# Patient Record
Sex: Female | Born: 1980 | Race: White | Hispanic: No | Smoking: Former smoker
Health system: Southern US, Community
[De-identification: ages and names within clinical notes are randomized; demographics above are authoritative.]

## PROBLEM LIST (undated history)

## (undated) ENCOUNTER — Inpatient Hospital Stay (HOSPITAL_COMMUNITY): Payer: Self-pay

## (undated) DIAGNOSIS — F419 Anxiety disorder, unspecified: Secondary | ICD-10-CM

## (undated) DIAGNOSIS — S3992XA Unspecified injury of lower back, initial encounter: Secondary | ICD-10-CM

## (undated) DIAGNOSIS — Z8742 Personal history of other diseases of the female genital tract: Secondary | ICD-10-CM

## (undated) DIAGNOSIS — R87629 Unspecified abnormal cytological findings in specimens from vagina: Secondary | ICD-10-CM

## (undated) DIAGNOSIS — M722 Plantar fascial fibromatosis: Secondary | ICD-10-CM

## (undated) DIAGNOSIS — R51 Headache: Secondary | ICD-10-CM

## (undated) DIAGNOSIS — O09519 Supervision of elderly primigravida, unspecified trimester: Secondary | ICD-10-CM

## (undated) DIAGNOSIS — F32A Depression, unspecified: Secondary | ICD-10-CM

## (undated) DIAGNOSIS — F329 Major depressive disorder, single episode, unspecified: Secondary | ICD-10-CM

## (undated) DIAGNOSIS — M549 Dorsalgia, unspecified: Secondary | ICD-10-CM

## (undated) DIAGNOSIS — M5126 Other intervertebral disc displacement, lumbar region: Secondary | ICD-10-CM

## (undated) DIAGNOSIS — G8929 Other chronic pain: Secondary | ICD-10-CM

## (undated) HISTORY — PX: DILATION AND CURETTAGE OF UTERUS: SHX78

## (undated) HISTORY — DX: Supervision of elderly primigravida, unspecified trimester: O09.519

## (undated) HISTORY — PX: CHOLECYSTECTOMY: SHX55

## (undated) HISTORY — DX: Anxiety disorder, unspecified: F41.9

## (undated) HISTORY — DX: Major depressive disorder, single episode, unspecified: F32.9

## (undated) HISTORY — PX: OTHER SURGICAL HISTORY: SHX169

## (undated) HISTORY — DX: Headache: R51

## (undated) HISTORY — DX: Depression, unspecified: F32.A

## (undated) HISTORY — PX: DILATION AND EVACUATION: SHX1459

## (undated) HISTORY — DX: Personal history of other diseases of the female genital tract: Z87.42

## (undated) HISTORY — PX: LAPAROSCOPY: SHX197

## (undated) HISTORY — DX: Unspecified abnormal cytological findings in specimens from vagina: R87.629

## (undated) HISTORY — PX: TUBAL LIGATION: SHX77

---

## 2006-11-16 ENCOUNTER — Inpatient Hospital Stay (HOSPITAL_COMMUNITY): Admission: AD | Admit: 2006-11-16 | Discharge: 2006-11-16 | Payer: Self-pay | Admitting: Obstetrics and Gynecology

## 2007-01-02 ENCOUNTER — Ambulatory Visit (HOSPITAL_COMMUNITY): Admission: RE | Admit: 2007-01-02 | Discharge: 2007-01-02 | Payer: Self-pay | Admitting: Surgery

## 2007-01-02 ENCOUNTER — Encounter (INDEPENDENT_AMBULATORY_CARE_PROVIDER_SITE_OTHER): Payer: Self-pay | Admitting: Specialist

## 2008-04-22 ENCOUNTER — Emergency Department (HOSPITAL_COMMUNITY): Admission: EM | Admit: 2008-04-22 | Discharge: 2008-04-22 | Payer: Self-pay | Admitting: Emergency Medicine

## 2009-12-14 ENCOUNTER — Emergency Department: Payer: Self-pay | Admitting: Emergency Medicine

## 2009-12-20 ENCOUNTER — Encounter: Admission: RE | Admit: 2009-12-20 | Discharge: 2009-12-20 | Payer: Self-pay | Admitting: Family Medicine

## 2010-01-13 ENCOUNTER — Emergency Department: Payer: Self-pay | Admitting: Emergency Medicine

## 2010-07-20 ENCOUNTER — Emergency Department: Payer: Self-pay | Admitting: Unknown Physician Specialty

## 2010-08-09 ENCOUNTER — Ambulatory Visit (HOSPITAL_COMMUNITY): Admission: RE | Admit: 2010-08-09 | Discharge: 2010-08-09 | Payer: Self-pay | Admitting: Obstetrics and Gynecology

## 2010-09-01 DEATH — deceased

## 2011-02-14 LAB — CBC
Platelets: 199 10*3/uL (ref 150–400)
RBC: 4.25 MIL/uL (ref 3.87–5.11)

## 2011-02-14 LAB — ABO/RH: ABO/RH(D): O POS

## 2011-04-19 NOTE — Op Note (Signed)
NAMESHARLEEN, [name]             ACCOUNT NO.:  [id number]   MEDICAL RECORD NO.:  [id number]          PATIENT TYPE:  AMB   LOCATION:  DAY                          FACILITY:  [facility]   PHYSICIAN:  Thomas A. Cornett, M.D.DATE OF BIRTH:  July 24, 1981   DATE OF PROCEDURE:  01/02/2007  DATE OF DISCHARGE:                               OPERATIVE REPORT   PREOPERATIVE DIAGNOSIS:  Symptomatic cholelithiasis.   POSTOPERATIVE DIAGNOSIS:  Symptomatic cholelithiasis.   PROCEDURE:  Laparoscopic cholecystectomy with intraoperative  cholangiogram.   SURGEON:  Dr. Harriette Bouillon.   ASSISTANT:  Dr. Cyndia Bent.   ANESTHESIA:  General endotracheal anesthesia with 20 mL of 0.25%  Sensorcaine without epinephrine.   ESTIMATED BLOOD LOSS:  30 mL.   DRAINS:  None.   SPECIMEN:  Gallbladder to pathology for evaluation.   INDICATIONS FOR PROCEDURE:  The patient is 30 year old female whose had  right upper quadrant pain and gallstones which have been symptomatic.  She has a diagnosis of symptomatic cholelithiasis and I recommended a  laparoscopic cholecystectomy for this.  She understood the procedure,  the risks and complications of the procedure which were outlined in the  history of physical and agreed to proceed.   DESCRIPTION OF PROCEDURE:  The patient was brought to the operating room  and placed supine.  After induction of general endotracheal anesthesia,  the abdomen prepped and draped in a sterile fashion.  She received  preoperative antibiotics 30 minutes before operative incision time. The  oral gastric  tube was in place.  A 1 cm supraumbilical incision was  made.  The fascia was identified and a small incision was made in the  fascia and  the fascia was grasped with Kochers.  I used my finger to  push through the peritoneal lining into the abdominal cavity without  difficulty.  A pursestring suture was then placed.  Hassan cannula was  placed under direct vision into the abdominal  cavity.  Pneumoperitoneum  was created to 15 mmHg with CO2 and a laparoscope was placed.  Laparoscopy was performed.  I saw no injury to solid or hollow organ  injury with insertion of the [facility].  Next the gallbladder and liver  were identified.  The patient was placed in reverse Trendelenburg and  rolled to her left.  A small 1 cm subxiphoid incision was placed and the  11-mm port was placed through that into the epigastrium.  Two 5 mm ports  were placed without difficulty in the right mid abdomen.  The  gallbladder was grabbed by its dome and retracted toward the patient's  right shoulder.  A second grasper was used to grab the infundibulum.  I  was able to pull some filmy adhesions away from the infundibulum of the  gallbladder to expose the cystic duct.  I was then able to dissect  circumferentially around the cystic duct.  This was the only tubular  structure entering the gallbladder.  A clip was placed in the  gallbladder side of this and a small incision was made in the cystic  duct until I got bile back. Through a separate stab  incision, a Cook  cholangiogram catheter was introduced, placed in the cystic duct and  held in place by a clip.  Intraoperative cholangiogram was performed.  The cystic duct was quite small.  There was flow into a nondilated  common bile duct into the duodenum.  There was then contrast that  refluxed up the common hepatic duct into right and left hepatic ducts.  She had no signs of stone obstruction or stricture or dilatation.  The  catheter was removed, the cystic duct was triple clipped and divided.  The cystic artery was identified, it was double clipped and divided.  I  placed a clip on a posterior branch of the cystic artery and divided the  remainder of that with the cautery on the posterior part of the  gallbladder.  I then used cautery to dissect the gallbladder from the  gallbladder fossa.  A small hole was made in the gallbladder that  spilled  bile but not stones.  We removed the remainder of the  gallbladder without difficulty.  It was placed in an EndoCatch bag.  We  then inspected the gallbladder bed, found it to be hemostatic, irrigated  out all the old bile and blood.  The clips were on the cystic duct and  cystic artery respectively with no signs of bleeding or bile leakage.  After the irrigation was suctioned out completely, the gallbladder was  extracted through the umbilicus and passed off the field.  I then closed  the umbilical fascial incision with #0 Vicryl.  The remainder of the  excess irrigation was suctioned out.  I reinspected the abdominal cavity  and saw no signs of hollow or solid organ injury at this point in time.  The ports were removed with no signs of port site bleeding, the CO2 was  allowed to escape.  I then closed all skin incisions with 4-0 Monocryl.  Steri-Strips and dry dressings were applied.  All final counts of  sponge, needle and instruments were found to be correct at the end of  the case.  The patient was then awoke and taken to recovery in  satisfactory condition.      Thomas A. Cornett, M.D.  Electronically Signed     TAC/MEDQ  D:  01/02/2007  T:  01/02/2007  Job:  [id number]   cc:   Caryn Bee L. Little, M.D.  Fax: [phone]

## 2012-04-14 ENCOUNTER — Ambulatory Visit: Payer: Self-pay | Admitting: Family Medicine

## 2012-05-28 ENCOUNTER — Emergency Department: Payer: Self-pay | Admitting: Emergency Medicine

## 2012-06-18 ENCOUNTER — Emergency Department: Payer: Self-pay | Admitting: Emergency Medicine

## 2012-06-18 LAB — COMPREHENSIVE METABOLIC PANEL
Anion Gap: 8 (ref 7–16)
BUN: 14 mg/dL (ref 7–18)
Bilirubin,Total: 0.2 mg/dL (ref 0.2–1.0)
Calcium, Total: 8.9 mg/dL (ref 8.5–10.1)
Chloride: 107 mmol/L (ref 98–107)
EGFR (African American): >60
EGFR (Non-African Amer.): >60
Glucose: 92 mg/dL (ref 65–99)
Osmolality: 281 (ref 275–301)
Potassium: 3.8 mmol/L (ref 3.5–5.1)
Sodium: 141 mmol/L (ref 136–145)
Total Protein: 7.7 g/dL (ref 6.4–8.2)

## 2012-06-18 LAB — URINALYSIS, COMPLETE
Glucose,UR: NEGATIVE mg/dL (ref 0–75)
Nitrite: NEGATIVE
Protein: NEGATIVE

## 2012-06-18 LAB — CBC
HCT: 42.5 % (ref 35.0–47.0)
HGB: 14.3 g/dL (ref 12.0–16.0)
MCHC: 33.5 g/dL (ref 32.0–36.0)
MCV: 92 fL (ref 80–100)
Platelet: 226 10*3/uL (ref 150–440)
RBC: 4.6 10*6/uL (ref 3.80–5.20)
RDW: 12.8 % (ref 11.5–14.5)
WBC: 13.5 10*3/uL — ABNORMAL HIGH (ref 3.6–11.0)

## 2013-12-02 NOTE — L&D Delivery Note (Signed)
Delivery Note At 9:21 AM a viable female was delivered via Vaginal, Spontaneous Delivery OA Presentation Apgars 9 9 weight pending Placenta status spontaneously with 3 vessel cord: , .  Cord:  with the following complications:none .  Cord pH: not obtained  Anesthesia:  epidural Episiotomy: none Lacerations: 2nd Suture Repair: 3.0 chromic Est. Blood Loss (mL): 300  Mom to postpartum.  Baby to Couplet care / Skin to Skin.  Malissia Rabbani L 08/16/2014, 10:00 AM

## 2014-01-11 LAB — OB RESULTS CONSOLE RPR: RPR: NONREACTIVE

## 2014-01-11 LAB — OB RESULTS CONSOLE ABO/RH: RH TYPE: POSITIVE

## 2014-01-11 LAB — OB RESULTS CONSOLE GC/CHLAMYDIA
Chlamydia: NEGATIVE
GC PROBE AMP, GENITAL: NEGATIVE

## 2014-01-11 LAB — OB RESULTS CONSOLE ANTIBODY SCREEN: ANTIBODY SCREEN: NEGATIVE

## 2014-01-11 LAB — OB RESULTS CONSOLE HEPATITIS B SURFACE ANTIGEN: Hepatitis B Surface Ag: NEGATIVE

## 2014-01-11 LAB — OB RESULTS CONSOLE HIV ANTIBODY (ROUTINE TESTING): HIV: NONREACTIVE

## 2014-01-11 LAB — OB RESULTS CONSOLE RUBELLA ANTIBODY, IGM: Rubella: NON-IMMUNE/NOT IMMUNE

## 2014-03-03 ENCOUNTER — Encounter (HOSPITAL_COMMUNITY): Payer: Self-pay | Admitting: *Deleted

## 2014-03-03 ENCOUNTER — Inpatient Hospital Stay (HOSPITAL_COMMUNITY)
Admission: AD | Admit: 2014-03-03 | Discharge: 2014-03-03 | Disposition: A | Discharge status: Home / Self Care | Payer: BC Managed Care – PPO | Source: Ambulatory Visit | Attending: Obstetrics and Gynecology | Admitting: Obstetrics and Gynecology

## 2014-03-03 DIAGNOSIS — Z87891 Personal history of nicotine dependence: Secondary | ICD-10-CM | POA: Insufficient documentation

## 2014-03-03 DIAGNOSIS — R51 Headache: Secondary | ICD-10-CM | POA: Insufficient documentation

## 2014-03-03 DIAGNOSIS — O99891 Other specified diseases and conditions complicating pregnancy: Secondary | ICD-10-CM | POA: Insufficient documentation

## 2014-03-03 DIAGNOSIS — R197 Diarrhea, unspecified: Secondary | ICD-10-CM

## 2014-03-03 DIAGNOSIS — O9989 Other specified diseases and conditions complicating pregnancy, childbirth and the puerperium: Principal | ICD-10-CM

## 2014-03-03 NOTE — MAU Note (Signed)
Pt states she has been having bloody stool today. Pt states diarrhea or very soft stool x 10 days. Pt seen by Dr Rana SnareLowe but is now having bloody stool.

## 2014-03-03 NOTE — Discharge Instructions (Signed)
Diet for Diarrhea, Adult  Frequent, runny stools (diarrhea) may be caused or worsened by food or drink. Diarrhea may be relieved by changing your diet. Since diarrhea can last up to 7 days, it is easy for you to lose too much fluid from the body and become dehydrated. Fluids that are lost need to be replaced. Along with a modified diet, make sure you drink enough fluids to keep your urine clear or pale yellow.  DIET INSTRUCTIONS  · Ensure adequate fluid intake (hydration): have 1 cup (8 oz) of fluid for each diarrhea episode. Avoid fluids that contain simple sugars or sports drinks, fruit juices, whole milk products, and sodas. Your urine should be clear or pale yellow if you are drinking enough fluids. Hydrate with an oral rehydration solution that you can purchase at pharmacies, retail stores, and online. You can prepare an oral rehydration solution at home by mixing the following ingredients together:  ·   tsp table salt.  · ¾ tsp baking soda.  ·  tsp salt substitute containing potassium chloride.  · 1  tablespoons sugar.  · 1 L (34 oz) of water.  · Certain foods and beverages may increase the speed at which food moves through the gastrointestinal (GI) tract. These foods and beverages should be avoided and include:  · Caffeinated and alcoholic beverages.  · High-fiber foods, such as raw fruits and vegetables, nuts, seeds, and whole grain breads and cereals.  · Foods and beverages sweetened with sugar alcohols, such as xylitol, sorbitol, and mannitol.  · Some foods may be well tolerated and may help thicken stool including:  · Starchy foods, such as rice, toast, pasta, low-sugar cereal, oatmeal, grits, baked potatoes, crackers, and bagels.    · Bananas.    · Applesauce.  · Add probiotic-rich foods to help increase healthy bacteria in the GI tract, such as yogurt and fermented milk products.  RECOMMENDED FOODS AND BEVERAGES  Starches  Choose foods with less than 2 g of fiber per serving.  · Recommended:  White,  French, and pita breads, plain rolls, buns, bagels. Plain muffins, matzo. Soda, saltine, or graham crackers. Pretzels, melba toast, zwieback. Cooked cereals made with water: cornmeal, farina, cream cereals. Dry cereals: refined corn, wheat, rice. Potatoes prepared any way without skins, refined macaroni, spaghetti, noodles, refined rice.  · Avoid:  Bread, rolls, or crackers made with whole wheat, multi-grains, rye, bran seeds, nuts, or coconut. Corn tortillas or taco shells. Cereals containing whole grains, multi-grains, bran, coconut, nuts, raisins. Cooked or dry oatmeal. Coarse wheat cereals, granola. Cereals advertised as "high-fiber." Potato skins. Whole grain pasta, wild or brown rice. Popcorn. Sweet potatoes, yams. Sweet rolls, doughnuts, waffles, pancakes, sweet breads.  Vegetables  · Recommended: Strained tomato and vegetable juices. Most well-cooked and canned vegetables without seeds. Fresh: Tender lettuce, cucumber without the skin, cabbage, spinach, bean sprouts.  · Avoid: Fresh, cooked, or canned: Artichokes, baked beans, beet greens, broccoli, Brussels sprouts, corn, kale, legumes, peas, sweet potatoes. Cooked: Green or red cabbage, spinach. Avoid large servings of any vegetables because vegetables shrink when cooked, and they contain more fiber per serving than fresh vegetables.  Fruit  · Recommended: Cooked or canned: Apricots, applesauce, cantaloupe, cherries, fruit cocktail, grapefruit, grapes, kiwi, mandarin oranges, peaches, pears, plums, watermelon. Fresh: Apples without skin, ripe banana, grapes, cantaloupe, cherries, grapefruit, peaches, oranges, plums. Keep servings limited to ½ cup or 1 piece.  · Avoid: Fresh: Apples with skin, apricots, mangoes, pears, raspberries, strawberries. Prune juice, stewed or dried prunes. Dried   fruits, raisins, dates. Large servings of all fresh fruits.  Protein  · Recommended: Ground or well-cooked tender beef, ham, veal, lamb, pork, or poultry. Eggs. Fish,  oysters, shrimp, lobster, other seafoods. Liver, organ meats.  · Avoid: Tough, fibrous meats with gristle. Peanut butter, smooth or chunky. Cheese, nuts, seeds, legumes, dried peas, beans, lentils.  Dairy  · Recommended: Yogurt, lactose-free milk, kefir, drinkable yogurt, buttermilk, soy milk, or plain hard cheese.  · Avoid: Milk, chocolate milk, beverages made with milk, such as milkshakes.  Soups  · Recommended: Bouillon, broth, or soups made from allowed foods. Any strained soup.  · Avoid: Soups made from vegetables that are not allowed, cream or milk-based soups.  Desserts and Sweets  · Recommended: Sugar-free gelatin, sugar-free frozen ice pops made without sugar alcohol.  · Avoid: Plain cakes and cookies, pie made with fruit, pudding, custard, cream pie. Gelatin, fruit, ice, sherbet, frozen ice pops. Ice cream, ice milk without nuts. Plain hard candy, honey, jelly, molasses, syrup, sugar, chocolate syrup, gumdrops, marshmallows.  Fats and Oils  · Recommended: Limit fats to less than 8 tsp per day.  · Avoid: Seeds, nuts, olives, avocados. Margarine, butter, cream, mayonnaise, salad oils, plain salad dressings. Plain gravy, crisp bacon without rind.  Beverages  · Recommended: Water, decaffeinated teas, oral rehydration solutions, sugar-free beverages not sweetened with sugar alcohols.  · Avoid: Fruit juices, caffeinated beverages (coffee, tea, soda), alcohol, sports drinks, or lemon-lime soda.  Condiments  · Recommended: Ketchup, mustard, horseradish, vinegar, cocoa powder. Spices in moderation: allspice, basil, bay leaves, celery powder or leaves, cinnamon, cumin powder, curry powder, ginger, mace, marjoram, onion or garlic powder, oregano, paprika, parsley flakes, ground pepper, rosemary, sage, savory, tarragon, thyme, turmeric.  · Avoid: Coconut, honey.  Document Released: 02/08/2004 Document Revised: 08/12/2012 Document Reviewed: 04/03/2012  Exit[employer]® Patient Information ©2014 [employer].

## 2014-03-03 NOTE — MAU Provider Note (Signed)
  History     CSN: [id number]  Arrival date and time: 03/03/14 2047   First Provider Initiated Contact with Patient 03/03/14 2310      Chief Complaint  Patient presents with  . Diarrhea   HPI  [name] is a 33 y.o. G2P0010 at 1668w3d who presents today with diarrhea. She states that she has had diarrhea since 02/21/14. She saw Dr. Rana SnareLowe in the office on 02/23/14. She did not have diarrhea for two days after that, but then it returned. She states that she has a soft stool each day, and occasionally she has a liquid stool. She denies any fever. She denies any recent antibiotics recently. Her step-daughter has been at their house since Monday, and has had vomiting. No other close contacts who have been sick. 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 She denies any nausea or vomiting. She states that occasionally the diarrhea will have a bad odor.   Past Medical History  Diagnosis Date  . Medical history non-contributory     Past Surgical History  Procedure Laterality Date  . Cholecystectomy    . Leep    . Dilation and curettage of uterus    . Dilation and evacuation    . Dilation and curettage of uterus    . Laparoscopy      No family history on file.  History  Substance Use Topics  . Smoking status: Former Smoker    Quit date: 03/03/2014  . Smokeless tobacco: Not on file  . Alcohol Use: Not on file    Allergies:  Allergies  Allergen Reactions  . Celebrex [Celecoxib]     hvies  . Wellbutrin [Bupropion]     hives    Prescriptions prior to admission  Medication Sig Dispense Refill  . Multiple Vitamins-Minerals (MULTIVITAMIN WITH MINERALS) tablet Take 1 tablet by mouth daily.        ROS Physical Exam   Blood pressure 117/69, pulse 78, temperature 98.5 F (36.9 C), temperature source Oral, height 5\' 6"  (1.676 m), weight 104.951 kg (231 lb 6 oz).  Physical Exam  Nursing note and vitals reviewed. Constitutional: She is oriented to person, place, and time.  She appears well-developed and well-nourished. No distress.  Cardiovascular: Normal rate.   Respiratory: Effort normal.  GI: Soft. There is no tenderness.  Neurological: She is alert and oriented to person, place, and time.  Skin: Skin is warm and dry.  Psychiatric: She has a normal mood and affect.    MAU Course  Procedures  2328: D/W With Dr. Marcelle OverlieHolland, ok for DC home at this time. Do not need to collect sample for culture or ova and parasite at this time. Will consider GI consult if sx persist.  Assessment and Plan   1. Diarrhea    BRAT diet Imodium OTC PRN FU with the office  Return to MAU as needed   Tawnya CrookHogan, Taneil Lazarus Donovan 03/03/2014, 11:14 PM

## 2014-03-03 NOTE — MAU Note (Signed)
PT SAYS  SHE WENT TO B-ROOM AT 450PM-   HAD SOFT  BM-  BLOODY -  DENIES HEMORROIDS.     DENIES RECTAL PAIN.   .  WENT TO DR LOWE ON 3-25- FOR DIARRHEA.-   THEN 28-29-  ALL OK.    THEN  ON 3-30-  STARTED  HAVING  SOFT - LOOSE BM    AGAIN-  ONLY TODAY- SAW BLOOD.

## 2014-03-03 NOTE — Progress Notes (Signed)
Pt states she had a headache 2 days ago

## 2014-05-24 ENCOUNTER — Encounter (HOSPITAL_COMMUNITY): Payer: Self-pay

## 2014-07-14 LAB — OB RESULTS CONSOLE GBS: GBS: NEGATIVE

## 2014-08-15 ENCOUNTER — Encounter (HOSPITAL_COMMUNITY): Payer: Self-pay | Admitting: *Deleted

## 2014-08-15 ENCOUNTER — Telehealth (HOSPITAL_COMMUNITY): Payer: Self-pay | Admitting: *Deleted

## 2014-08-15 ENCOUNTER — Inpatient Hospital Stay (HOSPITAL_COMMUNITY)
Admission: RE | Admit: 2014-08-15 | Discharge: 2014-08-18 | DRG: 775 | Disposition: A | Discharge status: Home / Self Care | Payer: BC Managed Care – PPO | Source: Ambulatory Visit | Attending: Obstetrics and Gynecology | Admitting: Obstetrics and Gynecology

## 2014-08-15 DIAGNOSIS — F341 Dysthymic disorder: Secondary | ICD-10-CM | POA: Diagnosis present

## 2014-08-15 DIAGNOSIS — Z87891 Personal history of nicotine dependence: Secondary | ICD-10-CM

## 2014-08-15 DIAGNOSIS — D649 Anemia, unspecified: Secondary | ICD-10-CM | POA: Diagnosis present

## 2014-08-15 DIAGNOSIS — O9902 Anemia complicating childbirth: Secondary | ICD-10-CM | POA: Diagnosis present

## 2014-08-15 DIAGNOSIS — Z8249 Family history of ischemic heart disease and other diseases of the circulatory system: Secondary | ICD-10-CM

## 2014-08-15 DIAGNOSIS — Z3490 Encounter for supervision of normal pregnancy, unspecified, unspecified trimester: Secondary | ICD-10-CM

## 2014-08-15 DIAGNOSIS — O99344 Other mental disorders complicating childbirth: Secondary | ICD-10-CM | POA: Diagnosis present

## 2014-08-15 DIAGNOSIS — O4100X0 Oligohydramnios, unspecified trimester, not applicable or unspecified: Secondary | ICD-10-CM | POA: Diagnosis present

## 2014-08-15 DIAGNOSIS — Z833 Family history of diabetes mellitus: Secondary | ICD-10-CM

## 2014-08-15 DIAGNOSIS — O09529 Supervision of elderly multigravida, unspecified trimester: Secondary | ICD-10-CM | POA: Diagnosis present

## 2014-08-15 LAB — CBC
HEMATOCRIT: 33.7 % — AB (ref 36.0–46.0)
HEMOGLOBIN: 11.6 g/dL — AB (ref 12.0–15.0)
MCH: 31.6 pg (ref 26.0–34.0)
MCHC: 34.4 g/dL (ref 30.0–36.0)
MCV: 91.8 fL (ref 78.0–100.0)
Platelets: 236 10*3/uL (ref 150–400)
RBC: 3.67 MIL/uL — AB (ref 3.87–5.11)
RDW: 14 % (ref 11.5–15.5)
WBC: 16.4 10*3/uL — AB (ref 4.0–10.5)

## 2014-08-15 MED ORDER — OXYTOCIN BOLUS FROM INFUSION
500.0000 mL | INTRAVENOUS | Status: DC
  Administered 2014-08-16: 500 mL via INTRAVENOUS

## 2014-08-15 MED ORDER — MISOPROSTOL 25 MCG QUARTER TABLET: 25.0000 ug | ORAL_TABLET | ORAL | Status: DC | PRN

## 2014-08-15 MED ORDER — ZOLPIDEM TARTRATE 5 MG PO TABS: 5.0000 mg | ORAL_TABLET | Freq: Every evening | ORAL | Status: DC | PRN

## 2014-08-15 MED ORDER — TERBUTALINE SULFATE 1 MG/ML IJ SOLN: 0.2500 mg | Freq: Once | INTRAMUSCULAR | Status: AC | PRN

## 2014-08-15 MED ORDER — OXYCODONE-ACETAMINOPHEN 5-325 MG PO TABS: 2.0000 | ORAL_TABLET | ORAL | Status: DC | PRN

## 2014-08-15 MED ORDER — MISOPROSTOL 25 MCG QUARTER TABLET
25.0000 ug | ORAL_TABLET | ORAL | Status: DC | PRN
  Administered 2014-08-15: 25 ug via VAGINAL
  Filled 2014-08-15: qty 0.25
  Filled 2014-08-15: qty 1
  Filled 2014-08-15: qty 0.25

## 2014-08-15 MED ORDER — CITRIC ACID-SODIUM CITRATE 334-500 MG/5ML PO SOLN: 30.0000 mL | ORAL | Status: DC | PRN

## 2014-08-15 MED ORDER — LACTATED RINGERS IV SOLN
INTRAVENOUS | Status: DC
  Administered 2014-08-15 – 2014-08-16 (×2): via INTRAVENOUS

## 2014-08-15 MED ORDER — OXYCODONE-ACETAMINOPHEN 5-325 MG PO TABS: 1.0000 | ORAL_TABLET | ORAL | Status: DC | PRN

## 2014-08-15 MED ORDER — LACTATED RINGERS IV SOLN
500.0000 mL | INTRAVENOUS | Status: DC | PRN
  Administered 2014-08-15: 500 mL via INTRAVENOUS

## 2014-08-15 MED ORDER — ACETAMINOPHEN 325 MG PO TABS: 650.0000 mg | ORAL_TABLET | ORAL | Status: DC | PRN

## 2014-08-15 MED ORDER — FLEET ENEMA 7-19 GM/118ML RE ENEM: 1.0000 | ENEMA | RECTAL | Status: DC | PRN

## 2014-08-15 MED ORDER — OXYTOCIN 40 UNITS IN LACTATED RINGERS INFUSION - SIMPLE MED: 62.5000 mL/h | INTRAVENOUS | Status: DC

## 2014-08-15 MED ORDER — ONDANSETRON HCL 4 MG/2ML IJ SOLN: 4.0000 mg | Freq: Four times a day (QID) | INTRAMUSCULAR | Status: DC | PRN

## 2014-08-15 MED ORDER — LIDOCAINE HCL (PF) 1 % IJ SOLN
30.0000 mL | INTRAMUSCULAR | Status: AC | PRN
  Administered 2014-08-16 (×2): 5 mL via SUBCUTANEOUS

## 2014-08-15 NOTE — H&P (Signed)
[name] is a 33 y.o. female presenting for IOL for oligohydramnios. U/S in office today showed AFI <4%. No ROM, no HA,no epigastric pain. Maternal Medical History:  Fetal activity: Perceived fetal activity is normal.      OB History   Grav Para Term Preterm Abortions TAB SAB Ect Mult Living   Past Medical History  Diagnosis Date  . Medical history non-contributory   . Vaginal Pap smear, abnormal   . Hx of endometriosis   . Headache(784.0)   . AMA (advanced maternal age) primigravida 35+   . Anxiety   . Depression    Past Surgical History  Procedure Laterality Date  . Cholecystectomy    . Leep    . Dilation and curettage of uterus    . Dilation and evacuation    . Dilation and curettage of uterus    . Laparoscopy     Family History: family history includes Cancer in her maternal grandfather, maternal grandmother, and sister; Diabetes in her paternal grandfather; Hypertension in her father. Social History:  reports that she quit smoking about 5 months ago. She does not have any smokeless tobacco history on file. Her alcohol and drug histories are not on file.   Prenatal Transfer Tool  Maternal Diabetes: No Genetic Screening: Normal Maternal Ultrasounds/Referrals: Normal Fetal Ultrasounds or other Referrals:  None Maternal Substance Abuse:  No Significant Maternal Medications:  None Significant Maternal Lab Results:  None Other Comments:  None  Review of Systems  Eyes: Negative for blurred vision.  Gastrointestinal: Negative for abdominal pain.  Neurological: Negative for headaches.    Dilation: Fingertip Effacement (%): 60 Station: -3 Exam by:: Lesleigh Hughson. MD  Blood pressure 142/88, pulse 87, temperature 97.8 F (36.6 C), temperature source Oral, resp. rate 20, height  (1.676 m), weight 125.646 kg (277 lb). Maternal Exam:  Uterine Assessment: Contraction strength is mild.  Contraction frequency is irregular.   Abdomen: Fetal  presentation: vertex     Fetal Exam Fetal State Assessment: Category I - tracings are normal.     Physical Exam  Cardiovascular: Normal rate and regular rhythm.   Respiratory: Effort normal and breath sounds normal.  GI: Soft. There is no tenderness.  Neurological: She has normal reflexes.    Prenatal labs: ABO, Rh: O/Positive/-- (02/10 0000) Antibody: Negative (02/10 0000) Rubella: Nonimmune (02/10 0000) RPR: Nonreactive (02/10 0000)  HBsAg: Negative (02/10 0000)  HIV: Non-reactive (02/10 0000)  GBS: Negative (08/13 0000)   Assessment/Plan: 33 yo G2P0 with oligohydramnios at 40 weeks D/W patient and husband induction and risks including c/S/ , fetal distress, failed induction All questions answered   Fareedah Mahler II,Ayzia Day E 08/15/2014, 9:50 PM

## 2014-08-15 NOTE — Telephone Encounter (Signed)
Preadmission screen  

## 2014-08-16 ENCOUNTER — Encounter (HOSPITAL_COMMUNITY): Payer: BC Managed Care – PPO | Admitting: Anesthesiology

## 2014-08-16 ENCOUNTER — Inpatient Hospital Stay (HOSPITAL_COMMUNITY): Payer: BC Managed Care – PPO | Admitting: Anesthesiology

## 2014-08-16 ENCOUNTER — Encounter (HOSPITAL_COMMUNITY): Payer: Self-pay

## 2014-08-16 LAB — TYPE AND SCREEN
ABO/RH(D): O POS
Antibody Screen: NEGATIVE

## 2014-08-16 LAB — RPR

## 2014-08-16 MED ORDER — LIDOCAINE HCL (PF) 1 % IJ SOLN
INTRAMUSCULAR | Status: AC
  Filled 2014-08-16: qty 30

## 2014-08-16 MED ORDER — PRENATAL MULTIVITAMIN CH
1.0000 | ORAL_TABLET | Freq: Every day | ORAL | Status: DC
  Administered 2014-08-17 – 2014-08-18 (×2): 1 via ORAL
  Filled 2014-08-16 (×2): qty 1

## 2014-08-16 MED ORDER — BUTORPHANOL TARTRATE 1 MG/ML IJ SOLN
INTRAMUSCULAR | Status: AC
  Filled 2014-08-16: qty 1

## 2014-08-16 MED ORDER — INFLUENZA VAC SPLIT QUAD 0.5 ML IM SUSY
0.5000 mL | PREFILLED_SYRINGE | INTRAMUSCULAR | Status: AC
  Administered 2014-08-17: 0.5 mL via INTRAMUSCULAR

## 2014-08-16 MED ORDER — DIPHENHYDRAMINE HCL 50 MG/ML IJ SOLN: 12.5000 mg | INTRAMUSCULAR | Status: DC | PRN

## 2014-08-16 MED ORDER — IBUPROFEN 600 MG PO TABS
600.0000 mg | ORAL_TABLET | Freq: Four times a day (QID) | ORAL | Status: DC
  Administered 2014-08-16 – 2014-08-18 (×8): 600 mg via ORAL
  Filled 2014-08-16 (×7): qty 1

## 2014-08-16 MED ORDER — OXYCODONE-ACETAMINOPHEN 5-325 MG PO TABS
1.0000 | ORAL_TABLET | ORAL | Status: DC | PRN
  Administered 2014-08-16 – 2014-08-18 (×6): 1 via ORAL
  Filled 2014-08-16 (×5): qty 1

## 2014-08-16 MED ORDER — BISACODYL 10 MG RE SUPP: 10.0000 mg | Freq: Every day | RECTAL | Status: DC | PRN

## 2014-08-16 MED ORDER — OXYCODONE-ACETAMINOPHEN 5-325 MG PO TABS
2.0000 | ORAL_TABLET | ORAL | Status: DC | PRN
  Filled 2014-08-16: qty 2

## 2014-08-16 MED ORDER — FENTANYL 2.5 MCG/ML BUPIVACAINE 1/10 % EPIDURAL INFUSION (WH - ANES)
14.0000 mL/h | INTRAMUSCULAR | Status: DC | PRN
  Administered 2014-08-16: 14 mL/h via EPIDURAL
  Filled 2014-08-16: qty 125

## 2014-08-16 MED ORDER — NALBUPHINE HCL 10 MG/ML IJ SOLN
10.0000 mg | INTRAMUSCULAR | Status: DC | PRN
  Filled 2014-08-16: qty 1

## 2014-08-16 MED ORDER — EPHEDRINE 5 MG/ML INJ
10.0000 mg | INTRAVENOUS | Status: DC | PRN
  Filled 2014-08-16: qty 2

## 2014-08-16 MED ORDER — OXYTOCIN 40 UNITS IN LACTATED RINGERS INFUSION - SIMPLE MED
1.0000 m[IU]/min | INTRAVENOUS | Status: DC
  Filled 2014-08-16: qty 1000

## 2014-08-16 MED ORDER — TERBUTALINE SULFATE 1 MG/ML IJ SOLN: 0.2500 mg | Freq: Once | INTRAMUSCULAR | Status: DC | PRN

## 2014-08-16 MED ORDER — MEDROXYPROGESTERONE ACETATE 150 MG/ML IM SUSP: 150.0000 mg | INTRAMUSCULAR | Status: DC | PRN

## 2014-08-16 MED ORDER — LACTATED RINGERS IV SOLN: 500.0000 mL | Freq: Once | INTRAVENOUS | Status: DC

## 2014-08-16 MED ORDER — DIBUCAINE 1 % RE OINT: 1.0000 "application " | TOPICAL_OINTMENT | RECTAL | Status: DC | PRN

## 2014-08-16 MED ORDER — PHENYLEPHRINE 40 MCG/ML (10ML) SYRINGE FOR IV PUSH (FOR BLOOD PRESSURE SUPPORT)
80.0000 ug | PREFILLED_SYRINGE | INTRAVENOUS | Status: DC | PRN
  Filled 2014-08-16: qty 2

## 2014-08-16 MED ORDER — NALBUPHINE HCL 10 MG/ML IJ SOLN
10.0000 mg | INTRAMUSCULAR | Status: DC | PRN
  Administered 2014-08-16: 10 mg via INTRAVENOUS

## 2014-08-16 MED ORDER — BENZOCAINE-MENTHOL 20-0.5 % EX AERO
1.0000 | INHALATION_SPRAY | CUTANEOUS | Status: DC | PRN
Start: 2014-08-16 — End: 2014-08-18
  Administered 2014-08-16: 1 via TOPICAL
  Filled 2014-08-16: qty 56

## 2014-08-16 MED ORDER — FLEET ENEMA 7-19 GM/118ML RE ENEM
1.0000 | ENEMA | Freq: Every day | RECTAL | Status: DC | PRN
Start: 2014-08-16 — End: 2014-08-18

## 2014-08-16 MED ORDER — SENNOSIDES-DOCUSATE SODIUM 8.6-50 MG PO TABS
2.0000 | ORAL_TABLET | ORAL | Status: DC
  Administered 2014-08-17 – 2014-08-18 (×2): 2 via ORAL
  Filled 2014-08-16 (×2): qty 2

## 2014-08-16 MED ORDER — BUTORPHANOL TARTRATE 1 MG/ML IJ SOLN
1.0000 mg | INTRAMUSCULAR | Status: DC | PRN
  Administered 2014-08-16 (×3): 1 mg via INTRAVENOUS
  Filled 2014-08-16 (×2): qty 1

## 2014-08-16 MED ORDER — MEASLES, MUMPS & RUBELLA VAC ~~LOC~~ INJ: 0.5000 mL | INJECTION | Freq: Once | SUBCUTANEOUS | Status: DC

## 2014-08-16 MED ORDER — ONDANSETRON HCL 4 MG/2ML IJ SOLN
4.0000 mg | INTRAMUSCULAR | Status: DC | PRN
Start: 2014-08-16 — End: 2014-08-18

## 2014-08-16 MED ORDER — DIPHENHYDRAMINE HCL 25 MG PO CAPS: 25.0000 mg | ORAL_CAPSULE | Freq: Four times a day (QID) | ORAL | Status: DC | PRN

## 2014-08-16 MED ORDER — ZOLPIDEM TARTRATE 5 MG PO TABS: 5.0000 mg | ORAL_TABLET | Freq: Every evening | ORAL | Status: DC | PRN

## 2014-08-16 MED ORDER — ONDANSETRON HCL 4 MG PO TABS: 4.0000 mg | ORAL_TABLET | ORAL | Status: DC | PRN

## 2014-08-16 MED ORDER — SIMETHICONE 80 MG PO CHEW: 80.0000 mg | CHEWABLE_TABLET | ORAL | Status: DC | PRN

## 2014-08-16 MED ORDER — PHENYLEPHRINE 40 MCG/ML (10ML) SYRINGE FOR IV PUSH (FOR BLOOD PRESSURE SUPPORT)
80.0000 ug | PREFILLED_SYRINGE | INTRAVENOUS | Status: DC | PRN
  Filled 2014-08-16: qty 10
  Filled 2014-08-16: qty 2

## 2014-08-16 MED ORDER — TETANUS-DIPHTH-ACELL PERTUSSIS 5-2.5-18.5 LF-MCG/0.5 IM SUSP: 0.5000 mL | Freq: Once | INTRAMUSCULAR | Status: DC

## 2014-08-16 MED ORDER — WITCH HAZEL-GLYCERIN EX PADS: 1.0000 "application " | MEDICATED_PAD | CUTANEOUS | Status: DC | PRN

## 2014-08-16 MED ORDER — LANOLIN HYDROUS EX OINT: TOPICAL_OINTMENT | CUTANEOUS | Status: DC | PRN

## 2014-08-16 NOTE — Anesthesia Postprocedure Evaluation (Signed)
  Anesthesia Post-op Note  Patient: [name]  Procedure(s) Performed: * No procedures listed *  Patient Location: PACU and Mother/Baby  Anesthesia Type:Epidural  Level of Consciousness: awake, alert , oriented and patient cooperative  Airway and Oxygen Therapy: Patient Spontanous Breathing  Post-op Pain: mild  Post-op Assessment: Post-op Vital signs reviewed, Patient's Cardiovascular Status Stable, Respiratory Function Stable, Patent Airway, No headache, No backache, No residual numbness and No residual motor weakness  Post-op Vital Signs: Reviewed and stable  Last Vitals:  Filed Vitals:   08/16/14 1145  BP:   Pulse: 86  Temp: 37 C  Resp: 18    Complications: No apparent anesthesia complications

## 2014-08-16 NOTE — Progress Notes (Signed)
About 1-2 hours ago, fetus had decel of about 5 min that resolved when I arrived to room Patient was repositioned, cx no change, O2 started Now FHT reactive with occasional variable decel UCs about q2-3 min

## 2014-08-16 NOTE — Anesthesia Procedure Notes (Signed)
Epidural Patient location during procedure: OB Start time: 08/16/2014 7:01 AM End time: 08/16/2014 7:12 AM  Staffing Anesthesiologist: Nicholis Stepanek, CHRIS Performed by: anesthesiologist   Preanesthetic Checklist Completed: patient identified, surgical consent, pre-op evaluation, timeout performed, IV checked, risks and benefits discussed and monitors and equipment checked  Epidural Patient position: sitting Prep: site prepped and draped and DuraPrep Patient monitoring: heart rate, cardiac monitor, continuous pulse ox and blood pressure Approach: midline Location: L4-L5 Injection technique: LOR saline  Needle:  Needle type: Tuohy  Needle gauge: 17 G Needle length: 9 cm Needle insertion depth: 7 cm Catheter type: closed end flexible Catheter size: 19 Gauge Catheter at skin depth: 13 cm Test dose: Other  Assessment Events: blood not aspirated, injection not painful, no injection resistance, negative IV test and no paresthesia  Additional Notes H+P and labs checked, risks and benefits discussed with the patient, consent obtained, procedure tolerated well and without complications.  Reason for block:procedure for pain

## 2014-08-16 NOTE — Anesthesia Preprocedure Evaluation (Signed)
Anesthesia Evaluation  Patient identified by MRN, date of birth, ID band Patient awake    Reviewed: Allergy & Precautions, H&P , NPO status , Patient's Chart, lab work & pertinent test results  Airway Mallampati: III TM Distance: >3 FB Neck ROM: Full    Dental  (+) Teeth Intact   Pulmonary neg sleep apnea, neg COPDneg recent URI, former smoker,  breath sounds clear to auscultation        Cardiovascular negative cardio ROS  Rhythm:Regular     Neuro/Psych  Headaches, PSYCHIATRIC DISORDERS Anxiety Depression    GI/Hepatic negative GI ROS, Neg liver ROS,   Endo/Other    Renal/GU negative Renal ROS     Musculoskeletal   Abdominal   Peds  Hematology  (+) anemia ,   Anesthesia Other Findings   Reproductive/Obstetrics (+) Pregnancy                           Anesthesia Physical Anesthesia Plan  ASA: II  Anesthesia Plan: Epidural   Post-op Pain Management:    Induction:   Airway Management Planned:   Additional Equipment:   Intra-op Plan:   Post-operative Plan:   Informed Consent: I have reviewed the patients History and Physical, chart, labs and discussed the procedure including the risks, benefits and alternatives for the proposed anesthesia with the patient or authorized representative who has indicated his/her understanding and acceptance.     Plan Discussed with: Anesthesiologist  Anesthesia Plan Comments:         Anesthesia Quick Evaluation

## 2014-08-16 NOTE — Lactation Note (Signed)
This note was copied from the chart of Boy Jassica Berns. Lactation Consultation Note  Patient Name: Boy Kapri Nero [id number] Date: 08/16/2014 Reason for consult: Initial assessment of this mom and baby at 12 hours postpartum. This is mom's first baby but FOB's 3rd child.  Mom states that she has been shown hand expression and that her nurse has assisted her with latching as needed.  FOB verbalizes support of mom's breastfeeding decision and is assisting her, as well.  LC reviewed basic breastfeeding information and encouraged cue feedings and frequent STS.  Mom has received a personal electric pump for return to work and [facility] encouraged her to wait 2 weeks before pumping unless baby not feeding well or latching.  LC also reviewed milk storage guidelines on page 25 of Baby and Me.   LC provided [facility] Resource brochure and reviewed [facility] services and list of community and web site resources. LC encouraged review of Baby and Me pp 9, 14 and 20-25 for STS and BF information.Mom encouraged to feed baby 8-12 times/24 hours and with feeding cues.     Maternal Data Has patient been taught Hand Expression?: Yes (per RN documentation at 1258 and mom also confirms being shown) Does the patient have breastfeeding experience prior to this delivery?: No  Feeding Feeding Type: Breast Fed Length of feed: 15 min  LATCH Score/Interventions           most recent LATCH score=8 per RN assessment           Lactation Tools Discussed/Used Pump Review: Milk Storage (mom has personal pump for return to work) STS, cue feedings, hand expression  Consult Status Consult Status: Follow-up Date: 08/17/14 Follow-up type: In-patient    Warrick Parisian [facility] 08/16/2014, 9:29 PM

## 2014-08-17 LAB — CBC
HCT: 32 % — ABNORMAL LOW (ref 36.0–46.0)
Hemoglobin: 10.8 g/dL — ABNORMAL LOW (ref 12.0–15.0)
MCH: 31 pg (ref 26.0–34.0)
MCHC: 33.8 g/dL (ref 30.0–36.0)
MCV: 92 fL (ref 78.0–100.0)
Platelets: 188 10*3/uL (ref 150–400)
RBC: 3.48 MIL/uL — AB (ref 3.87–5.11)
RDW: 13.9 % (ref 11.5–15.5)
WBC: 14.6 10*3/uL — ABNORMAL HIGH (ref 4.0–10.5)

## 2014-08-17 NOTE — Progress Notes (Signed)
Post Partum Day 1 Subjective: up ad lib, voiding, tolerating PO and baby in NICU for tachypnea  Objective: Blood pressure 119/68, pulse 75, temperature 98.1 F (36.7 C), temperature source Oral, resp. rate 18, height  (1.676 m), weight 277 lb (125.646 kg), SpO2 99.00%, unknown if currently breastfeeding.  Physical Exam:  General: alert and cooperative Lochia: appropriate Uterine Fundus: firm Incision: perineum intact DVT Evaluation: No evidence of DVT seen on physical exam. Negative Homan's sign. No cords or calf tenderness. Calf/Ankle edema is present.   Recent Labs  08/15/14 2156 08/17/14 0546  HGB 11.6* 10.8*  HCT 33.7* 32.0*    Assessment/Plan: Plan for discharge tomorrow   LOS: 2 days   [name] 08/17/2014, 7:50 AM

## 2014-08-18 MED ORDER — IBUPROFEN 600 MG PO TABS: 600.0000 mg | ORAL_TABLET | Freq: Four times a day (QID) | ORAL | Status: DC

## 2014-08-18 MED ORDER — MEASLES, MUMPS & RUBELLA VAC ~~LOC~~ INJ
0.5000 mL | INJECTION | Freq: Once | SUBCUTANEOUS | Status: AC
  Administered 2014-08-18: 0.5 mL via SUBCUTANEOUS
  Filled 2014-08-18: qty 0.5

## 2014-08-18 MED ORDER — SERTRALINE HCL 50 MG PO TABS
50.0000 mg | ORAL_TABLET | Freq: Every day | ORAL | Status: DC
  Administered 2014-08-18: 50 mg via ORAL
  Filled 2014-08-18 (×2): qty 1

## 2014-08-18 MED ORDER — OXYCODONE-ACETAMINOPHEN 5-325 MG PO TABS: 1.0000 | ORAL_TABLET | ORAL | Status: DC | PRN

## 2014-08-18 MED ORDER — SERTRALINE HCL 50 MG PO TABS: 50.0000 mg | ORAL_TABLET | Freq: Every day | ORAL | Status: DC

## 2014-08-18 NOTE — Discharge Summary (Signed)
Obstetric Discharge Summary Reason for Admission: induction of labor Prenatal Procedures: ultrasound Intrapartum Procedures: spontaneous vaginal delivery Postpartum Procedures: none Complications-Operative and Postpartum: 2 degree perineal laceration Hemoglobin  Date Value Ref Range Status  08/17/2014 10.8* 12.0 - 15.0 g/dL Final     HCT  Date Value Ref Range Status  08/17/2014 32.0* 36.0 - 46.0 % Final    Physical Exam:  General: alert and cooperative Lochia: appropriate Uterine Fundus: firm Incision: perineum intact DVT Evaluation: No evidence of DVT seen on physical exam. Negative Homan's sign. No cords or calf tenderness. Calf/Ankle edema is present.  Discharge Diagnoses: Term Pregnancy-delivered  Discharge Information: Date: 08/18/2014 Activity: pelvic rest Diet: routine Medications: PNV, Ibuprofen, Percocet and zoloft Condition: stable Instructions: refer to practice specific booklet Discharge to: home   Newborn Data: Live born female  Birth Weight: 8 lb (3629 g) APGAR: 7, 9  Home with NICU.  Elyn Krogh G 08/18/2014, 8:52 AM

## 2014-08-18 NOTE — Lactation Note (Signed)
This note was copied from the chart of [name]. Lactation Consultation Note      Follow up consult with this mom of a term baby in the NICU for tachypnea. He is being fed ng at this time. I did teaching with mom from the NICU booklet on how to provide eBm for a NICU baby. I showed mom how to hand express, and she had an easy flow of colostrum. I advised mom to add hand expression after each pumping. I also showed mom the pumping rooms in NICU.  Mom is aware she will be able to breast feed her baby in NICU, once his breathing is normal. Mom knows to call for lactation assist as needed, while in the NICU, and o/p also.support group recommended to mom.  Patient Name: Pamela Gena Laski [id number] Date: 08/18/2014 Reason for consult: Follow-up assessment;NICU baby   Maternal Data    Feeding Feeding Type: Breast Milk with Formula added Length of feed: 15 min  LATCH Score/Interventions                      Lactation Tools Discussed/Used     Consult Status Consult Status: PRN Date: 08/19/14 Follow-up type: In-patient    Alfred Levins 08/18/2014, 12:46 PM

## 2014-08-19 ENCOUNTER — Ambulatory Visit: Payer: Self-pay

## 2014-08-19 NOTE — Lactation Note (Signed)
This note was copied from the chart of Pamela Rajni Crammer. Lactation Consultation Note  Patient Name: [name] RUEAV'W Date: 08/19/2014  Called by NICU RN to assist Mom with getting baby to latch. Mom reports baby latched after delivery but has not been to the breast since due to admission to NICU for tachypnea and r/o sepsis. Baby awake/alert giving feeding ques. Demonstrated hand expression Mom has transitional milk - few drops. Mom reports she has been consistently pumping and received greater than 20 ml with last pumping. Assisted Mom with latching baby in football hold. It took several attempts for baby to sustain the latch, but once he did, he demonstrated a good suckling pattern with some swallows noted. Baby nursed on the right breast for 25 minutes, then became sleepy. Advised Mom to pre-pump before attempting to latch baby with next feeding. Baby getting supplements of EBM/formula after this feeding. Advised Mom to call with next feeding to assist with latch on the left breast. We may consider SNS to supplement as needed. Mom has DEBP for home. Mom reports they will be rooming-in tonight.    Maternal Data    Feeding Feeding Type: Breast Milk with Formula added Nipple Type: Slow - flow Length of feed: 60 min (30" BF / 30" Bottle)  LATCH Score/Interventions                      Lactation Tools Discussed/Used     Consult Status      Alfred Levins 08/19/2014, 1:20 PM

## 2014-10-03 ENCOUNTER — Encounter (HOSPITAL_COMMUNITY): Payer: Self-pay

## 2015-03-05 ENCOUNTER — Encounter (HOSPITAL_COMMUNITY): Payer: Self-pay

## 2015-03-05 ENCOUNTER — Inpatient Hospital Stay (HOSPITAL_COMMUNITY)
Admission: AD | Admit: 2015-03-05 | Discharge: 2015-03-05 | Disposition: A | Discharge status: Home / Self Care | Payer: BLUE CROSS/BLUE SHIELD | Source: Ambulatory Visit | Attending: Obstetrics and Gynecology | Admitting: Obstetrics and Gynecology

## 2015-03-05 DIAGNOSIS — N750 Cyst of Bartholin's gland: Secondary | ICD-10-CM | POA: Diagnosis present

## 2015-03-05 DIAGNOSIS — N764 Abscess of vulva: Secondary | ICD-10-CM | POA: Diagnosis not present

## 2015-03-05 DIAGNOSIS — Z87891 Personal history of nicotine dependence: Secondary | ICD-10-CM | POA: Insufficient documentation

## 2015-03-05 MED ORDER — LIDOCAINE 5 % EX OINT
TOPICAL_OINTMENT | Freq: Once | CUTANEOUS | Status: AC
  Administered 2015-03-05: 17:00:00 via TOPICAL
  Filled 2015-03-05: qty 35.44

## 2015-03-05 MED ORDER — LIDOCAINE HCL (PF) 1 % IJ SOLN: 5.0000 mL | Freq: Once | INTRAMUSCULAR | Status: DC

## 2015-03-05 MED ORDER — LIDOCAINE HCL (PF) 2 % IJ SOLN: 0.0000 mL | Freq: Once | INTRAMUSCULAR | Status: DC | PRN

## 2015-03-05 MED ORDER — CLINDAMYCIN HCL 300 MG PO CAPS: 300.0000 mg | ORAL_CAPSULE | Freq: Three times a day (TID) | ORAL | Status: DC

## 2015-03-05 MED ORDER — IBUPROFEN 800 MG PO TABS
800.0000 mg | ORAL_TABLET | Freq: Once | ORAL | Status: AC
  Administered 2015-03-05: 800 mg via ORAL
  Filled 2015-03-05: qty 1

## 2015-03-05 MED ORDER — OXYCODONE-ACETAMINOPHEN 5-325 MG PO TABS
2.0000 | ORAL_TABLET | Freq: Once | ORAL | Status: AC
  Administered 2015-03-05: 2 via ORAL
  Filled 2015-03-05: qty 2

## 2015-03-05 MED ORDER — OXYCODONE-ACETAMINOPHEN 5-325 MG PO TABS: 1.0000 | ORAL_TABLET | ORAL | Status: DC | PRN

## 2015-03-05 NOTE — MAU Note (Addendum)
Pt presents complaining of a possible Bartholin's cyst on the right side. States it started Monday and is getting worse.

## 2015-03-05 NOTE — MAU Provider Note (Signed)
History     CSN: [id number]  Arrival date and time: 03/05/15 1515   First Provider Initiated Contact with Patient 03/05/15 1538      Chief Complaint  Patient presents with  . Bartholin's Cyst   HPI 34 y.o. G2P1011 with "cyst" on R vulva x 1 week. Pt states it started like small pimple on labia, has progressively worsened, became very swollen and painful today.   Past Medical History  Diagnosis Date  . Medical history non-contributory   . Vaginal Pap smear, abnormal   . Hx of endometriosis   . Headache(784.0)   . AMA (advanced maternal age) primigravida 35+   . Anxiety   . Depression     Past Surgical History  Procedure Laterality Date  . Cholecystectomy    . Leep    . Dilation and curettage of uterus    . Dilation and evacuation    . Dilation and curettage of uterus    . Laparoscopy      Family History  Problem Relation Age of Onset  . Hypertension Father   . Cancer Sister     cervical  . Cancer Maternal Grandmother     bone  . Cancer Maternal Grandfather     brain and colon  . Diabetes Paternal Grandfather     History  Substance Use Topics  . Smoking status: Former Smoker    Quit date: 03/03/2014  . Smokeless tobacco: Not on file  . Alcohol Use: Not on file    Allergies:  Allergies  Allergen Reactions  . Celebrex [Celecoxib] Hives    Pt is able to take ibuprofen without problems  . Wellbutrin [Bupropion]     hives    No prescriptions prior to admission    Review of Systems  Constitutional: Negative.  Negative for fever and chills.  Respiratory: Negative.   Cardiovascular: Negative.   Gastrointestinal: Negative for nausea, vomiting, abdominal pain, diarrhea and constipation.  Genitourinary: Negative for dysuria, urgency, frequency, hematuria and flank pain.       Negative for vaginal bleeding, vaginal discharge, dyspareunia  Musculoskeletal: Negative.   Neurological: Negative.   Psychiatric/Behavioral: Negative.    Physical Exam    Blood pressure 136/80, pulse 88, temperature 98 F (36.7 C), temperature source Oral, resp. rate 18, last menstrual period 02/12/2015, unknown if currently breastfeeding.  Physical Exam  Nursing note and vitals reviewed. Constitutional: She is oriented to person, place, and time. She appears well-developed and well-nourished. No distress (uncomfortable appearing).  Cardiovascular: Normal rate.   Respiratory: Effort normal.  Genitourinary:    There is tenderness and lesion on the right labia. There is no rash on the right labia. There is no rash, tenderness or lesion on the left labia.  Musculoskeletal: Normal range of motion.  Neurological: She is alert and oriented to person, place, and time.  Skin: Skin is warm and dry.  Psychiatric: She has a normal mood and affect.    MAU Course  Procedures  Pt agreed to I&D of abscess, Percocet 5/325 #2 given for pain relief, pt self applied lidocaine 5% ointment, at which time abscess opened on it's own, expressed moderate amount of purulent fluid.   Assessment and Plan   1. Labial abscess   Clindamycin rx, Percocet rx for pain, warm soaks, f/u in office this week    Medication List    STOP taking these medications        ibuprofen 600 MG tablet  Commonly known as:  ADVIL,MOTRIN  ibuprofen 800 MG tablet  Commonly known as:  ADVIL,MOTRIN      TAKE these medications        clindamycin 300 MG capsule  Commonly known as:  CLEOCIN  Take 1 capsule (300 mg total) by mouth 3 (three) times daily.     diphenhydrAMINE 25 MG tablet  Commonly known as:  BENADRYL  Take 25 mg by mouth every 6 (six) hours as needed for allergies (injection site rxn).     EPINEPHrine 0.3 mg/0.3 mL Soaj injection  Commonly known as:  EPI-PEN  Inject 0.3 mg into the muscle once.     fluticasone 27.5 MCG/SPRAY nasal spray  Commonly known as:  VERAMYST  Place 1 spray into the nose 2 (two) times daily as needed for rhinitis.      HYDROcodone-acetaminophen 5-325 MG per tablet  Commonly known as:  NORCO/VICODIN  Take 1 tablet by mouth every 6 (six) hours as needed for moderate pain.     loratadine-pseudoephedrine 10-240 MG per 24 hr tablet  Commonly known as:  CLARITIN-D 24-hour  Take 1 tablet by mouth daily.     norgestimate-ethinyl estradiol 0.25-35 MG-MCG tablet  Commonly known as:  ORTHO-CYCLEN,SPRINTEC,PREVIFEM  Take 1 tablet by mouth daily.     oxyCODONE-acetaminophen 5-325 MG per tablet  Commonly known as:  PERCOCET/ROXICET  Take 1 tablet by mouth every 4 (four) hours as needed for severe pain.     PRESCRIPTION MEDICATION  Inject 1 Dose into the muscle 2 (two) times a week. Pt states administering allergy injections twice weekly; unknown product/strength.     sertraline 100 MG tablet  Commonly known as:  ZOLOFT  Take 100 mg by mouth daily.            Follow-up Information    Follow up with LOWE,DAVID C, MD In 3 days.   Specialty:  Obstetrics and Gynecology   Why:  or sooner if symptoms worsen   Contact information:   [address] [place], [address] [place] [address] [phone]         [facility] 03/05/2015, 8:34 PM

## 2015-03-05 NOTE — Discharge Instructions (Signed)

## 2016-01-04 ENCOUNTER — Other Ambulatory Visit: Payer: Self-pay | Admitting: Family Medicine

## 2016-01-04 ENCOUNTER — Ambulatory Visit
Admission: RE | Admit: 2016-01-04 | Discharge: 2016-01-04 | Disposition: A | Discharge status: Home / Self Care | Payer: BLUE CROSS/BLUE SHIELD | Source: Ambulatory Visit | Attending: Family Medicine | Admitting: Family Medicine

## 2016-01-04 ENCOUNTER — Ambulatory Visit
Admission: RE | Admit: 2016-01-04 | Discharge: 2016-01-04 | Disposition: A | Discharge status: Home / Self Care | Payer: BLUE CROSS/BLUE SHIELD | Source: Ambulatory Visit | Attending: Physician Assistant | Admitting: Physician Assistant

## 2016-01-04 DIAGNOSIS — M545 Low back pain, unspecified: Secondary | ICD-10-CM

## 2016-01-04 DIAGNOSIS — M5136 Other intervertebral disc degeneration, lumbar region: Secondary | ICD-10-CM | POA: Insufficient documentation

## 2016-01-04 DIAGNOSIS — W19XXXA Unspecified fall, initial encounter: Secondary | ICD-10-CM | POA: Diagnosis not present

## 2016-01-04 DIAGNOSIS — S322XXA Fracture of coccyx, initial encounter for closed fracture: Secondary | ICD-10-CM | POA: Insufficient documentation

## 2016-03-07 DIAGNOSIS — M5136 Other intervertebral disc degeneration, lumbar region: Secondary | ICD-10-CM | POA: Diagnosis not present

## 2016-03-11 DIAGNOSIS — M545 Low back pain: Secondary | ICD-10-CM | POA: Diagnosis not present

## 2016-03-22 DIAGNOSIS — M5136 Other intervertebral disc degeneration, lumbar region: Secondary | ICD-10-CM | POA: Diagnosis not present

## 2016-03-22 DIAGNOSIS — M545 Low back pain: Secondary | ICD-10-CM | POA: Diagnosis not present

## 2016-04-09 DIAGNOSIS — M5136 Other intervertebral disc degeneration, lumbar region: Secondary | ICD-10-CM | POA: Diagnosis not present

## 2016-05-01 DIAGNOSIS — M5136 Other intervertebral disc degeneration, lumbar region: Secondary | ICD-10-CM | POA: Diagnosis not present

## 2016-05-10 DIAGNOSIS — M5136 Other intervertebral disc degeneration, lumbar region: Secondary | ICD-10-CM | POA: Diagnosis not present

## 2016-05-10 DIAGNOSIS — M545 Low back pain: Secondary | ICD-10-CM | POA: Diagnosis not present

## 2016-05-29 DIAGNOSIS — M5136 Other intervertebral disc degeneration, lumbar region: Secondary | ICD-10-CM | POA: Diagnosis not present

## 2016-06-26 DIAGNOSIS — M25551 Pain in right hip: Secondary | ICD-10-CM | POA: Diagnosis not present

## 2016-07-08 DIAGNOSIS — R102 Pelvic and perineal pain: Secondary | ICD-10-CM | POA: Diagnosis not present

## 2016-07-16 DIAGNOSIS — M5106 Intervertebral disc disorders with myelopathy, lumbar region: Secondary | ICD-10-CM | POA: Diagnosis not present

## 2016-08-21 DIAGNOSIS — R102 Pelvic and perineal pain: Secondary | ICD-10-CM | POA: Diagnosis not present

## 2016-08-21 DIAGNOSIS — M5106 Intervertebral disc disorders with myelopathy, lumbar region: Secondary | ICD-10-CM | POA: Diagnosis not present

## 2016-09-03 DIAGNOSIS — E669 Obesity, unspecified: Secondary | ICD-10-CM | POA: Diagnosis not present

## 2016-09-03 DIAGNOSIS — Z6839 Body mass index (BMI) 39.0-39.9, adult: Secondary | ICD-10-CM | POA: Diagnosis not present

## 2016-09-03 DIAGNOSIS — A084 Viral intestinal infection, unspecified: Secondary | ICD-10-CM | POA: Diagnosis not present

## 2016-09-17 DIAGNOSIS — M545 Low back pain: Secondary | ICD-10-CM | POA: Diagnosis not present

## 2016-09-17 DIAGNOSIS — N911 Secondary amenorrhea: Secondary | ICD-10-CM | POA: Diagnosis not present

## 2016-09-17 DIAGNOSIS — N926 Irregular menstruation, unspecified: Secondary | ICD-10-CM | POA: Diagnosis not present

## 2016-09-25 DIAGNOSIS — Z23 Encounter for immunization: Secondary | ICD-10-CM | POA: Diagnosis not present

## 2016-09-25 DIAGNOSIS — Z3481 Encounter for supervision of other normal pregnancy, first trimester: Secondary | ICD-10-CM | POA: Diagnosis not present

## 2016-09-25 DIAGNOSIS — Z3685 Encounter for antenatal screening for Streptococcus B: Secondary | ICD-10-CM | POA: Diagnosis not present

## 2016-09-25 LAB — OB RESULTS CONSOLE HEPATITIS B SURFACE ANTIGEN: HEP B S AG: NEGATIVE

## 2016-09-25 LAB — OB RESULTS CONSOLE RPR: RPR: NONREACTIVE

## 2016-09-25 LAB — OB RESULTS CONSOLE GC/CHLAMYDIA
Chlamydia: NEGATIVE
Gonorrhea: NEGATIVE

## 2016-09-25 LAB — OB RESULTS CONSOLE HIV ANTIBODY (ROUTINE TESTING): HIV: NONREACTIVE

## 2016-09-25 LAB — OB RESULTS CONSOLE RUBELLA ANTIBODY, IGM: Rubella: IMMUNE

## 2016-10-01 DIAGNOSIS — Z3491 Encounter for supervision of normal pregnancy, unspecified, first trimester: Secondary | ICD-10-CM | POA: Diagnosis not present

## 2016-10-01 DIAGNOSIS — Z3480 Encounter for supervision of other normal pregnancy, unspecified trimester: Secondary | ICD-10-CM | POA: Diagnosis not present

## 2016-10-01 DIAGNOSIS — Z36 Encounter for antenatal screening for chromosomal anomalies: Secondary | ICD-10-CM | POA: Diagnosis not present

## 2016-10-08 DIAGNOSIS — Z3A12 12 weeks gestation of pregnancy: Secondary | ICD-10-CM | POA: Diagnosis not present

## 2016-10-08 DIAGNOSIS — O09521 Supervision of elderly multigravida, first trimester: Secondary | ICD-10-CM | POA: Diagnosis not present

## 2016-10-08 DIAGNOSIS — Z3682 Encounter for antenatal screening for nuchal translucency: Secondary | ICD-10-CM | POA: Diagnosis not present

## 2016-10-15 ENCOUNTER — Inpatient Hospital Stay (HOSPITAL_COMMUNITY)
Admission: AD | Admit: 2016-10-15 | Discharge: 2016-10-15 | Disposition: A | Discharge status: Home / Self Care | Payer: BLUE CROSS/BLUE SHIELD | Source: Ambulatory Visit | Attending: Obstetrics & Gynecology | Admitting: Obstetrics & Gynecology

## 2016-10-15 ENCOUNTER — Encounter (HOSPITAL_COMMUNITY): Payer: Self-pay | Admitting: *Deleted

## 2016-10-15 DIAGNOSIS — G43909 Migraine, unspecified, not intractable, without status migrainosus: Secondary | ICD-10-CM | POA: Diagnosis present

## 2016-10-15 DIAGNOSIS — R51 Headache: Secondary | ICD-10-CM | POA: Insufficient documentation

## 2016-10-15 DIAGNOSIS — G43101 Migraine with aura, not intractable, with status migrainosus: Secondary | ICD-10-CM

## 2016-10-15 DIAGNOSIS — Z3A13 13 weeks gestation of pregnancy: Secondary | ICD-10-CM | POA: Diagnosis not present

## 2016-10-15 DIAGNOSIS — Z87891 Personal history of nicotine dependence: Secondary | ICD-10-CM | POA: Insufficient documentation

## 2016-10-15 DIAGNOSIS — O26891 Other specified pregnancy related conditions, first trimester: Secondary | ICD-10-CM | POA: Insufficient documentation

## 2016-10-15 HISTORY — DX: Other chronic pain: G89.29

## 2016-10-15 HISTORY — DX: Dorsalgia, unspecified: M54.9

## 2016-10-15 LAB — URINALYSIS, ROUTINE W REFLEX MICROSCOPIC
Bilirubin Urine: NEGATIVE
Glucose, UA: NEGATIVE mg/dL
HGB URINE DIPSTICK: NEGATIVE
Ketones, ur: NEGATIVE mg/dL
Leukocytes, UA: NEGATIVE
Nitrite: NEGATIVE
PH: 5.5 (ref 5.0–8.0)
Protein, ur: NEGATIVE mg/dL
SPECIFIC GRAVITY, URINE: 1.025 (ref 1.005–1.030)

## 2016-10-15 MED ORDER — LACTATED RINGERS IV BOLUS (SEPSIS)
1000.0000 mL | Freq: Once | INTRAVENOUS | Status: AC
  Administered 2016-10-15: 1000 mL via INTRAVENOUS

## 2016-10-15 MED ORDER — DIPHENHYDRAMINE HCL 50 MG/ML IJ SOLN
25.0000 mg | Freq: Once | INTRAMUSCULAR | Status: AC
  Administered 2016-10-15: 25 mg via INTRAVENOUS
  Filled 2016-10-15: qty 1

## 2016-10-15 MED ORDER — PROMETHAZINE HCL 25 MG PO TABS: 25.0000 mg | ORAL_TABLET | Freq: Four times a day (QID) | ORAL | 0 refills | Status: DC | PRN

## 2016-10-15 MED ORDER — METOCLOPRAMIDE HCL 5 MG/ML IJ SOLN
10.0000 mg | Freq: Once | INTRAMUSCULAR | Status: AC
  Administered 2016-10-15: 10 mg via INTRAVENOUS
  Filled 2016-10-15: qty 2

## 2016-10-15 MED ORDER — BUTALBITAL-APAP-CAFFEINE 50-325-40 MG PO TABS: 1.0000 | ORAL_TABLET | Freq: Four times a day (QID) | ORAL | 0 refills | Status: DC | PRN

## 2016-10-15 MED ORDER — DEXAMETHASONE SODIUM PHOSPHATE 10 MG/ML IJ SOLN
10.0000 mg | Freq: Once | INTRAMUSCULAR | Status: AC
  Administered 2016-10-15: 10 mg via INTRAVENOUS
  Filled 2016-10-15: qty 1

## 2016-10-15 MED ORDER — DIPHENHYDRAMINE HCL 25 MG PO CAPS: 25.0000 mg | ORAL_CAPSULE | Freq: Once | ORAL | Status: DC

## 2016-10-15 NOTE — MAU Note (Signed)
States she has been vomiting since beginning of pregnancy. Has also had some diarrhea, but has had X 3 today. Main C/O is HA, unrelieved with Tylenol. States she has a hx of migraines and this is her 3rd migraine in 13 wks.

## 2016-10-15 NOTE — MAU Provider Note (Signed)
History     CSN: [id number]  Arrival date and time: 10/15/16 1747   First Provider Initiated Contact with Patient 10/15/16 1828      Chief Complaint  Patient presents with  . Emesis During Pregnancy  . Diarrhea  . Headache   HPI Pt is 35 y.o.G3P1011 5687w0d who presents with migraine headache in pregnancy.  Pt has hx of migraines and usually takes Zomig.  Today she took tylenol without relief.  Pt has had nausea and vomiting.  Pt denies spotting or bleeding. Headache is mostly on left side of temple- has photophobia RN note: States she has been vomiting since beginning of pregnancy. Has also had some diarrhea, but has had X 3 today. Main C/O is HA, unrelieved with Tylenol. States she has a hx of migraines and this is her 3rd migraine in 13 wks.  Past Medical History:  Diagnosis Date  . AMA (advanced maternal age) primigravida 35+   . Anxiety   . Back pain, chronic   . Depression   . Headache(784.0)   . Hx of endometriosis   . Medical history non-contributory   . Vaginal Pap smear, abnormal     Past Surgical History:  Procedure Laterality Date  . CHOLECYSTECTOMY    . DILATION AND CURETTAGE OF UTERUS    . DILATION AND CURETTAGE OF UTERUS    . DILATION AND EVACUATION    . LAPAROSCOPY    . Leep      Family History  Problem Relation Age of Onset  . Hypertension Father   . Cancer Sister     cervical  . Cancer Maternal Grandmother     bone  . Cancer Maternal Grandfather     brain and colon  . Diabetes Paternal Grandfather     Social History  Substance Use Topics  . Smoking status: Former Smoker    Quit date: 03/03/2014  . Smokeless tobacco: Never Used  . Alcohol use No    Allergies:  Allergies  Allergen Reactions  . Celebrex [Celecoxib] Hives    Pt is able to take ibuprofen without problems  . Wellbutrin [Bupropion]     hives    Prescriptions Prior to Admission  Medication Sig Dispense Refill Last Dose  . acetaminophen (TYLENOL) 325 MG tablet Take 650  mg by mouth every 6 (six) hours as needed for moderate pain.   10/15/2016 at Unknown time  . diphenhydrAMINE (BENADRYL) 12.5 MG/5ML liquid Take 25 mg by mouth daily as needed for allergies.   10/15/2016 at Unknown time  . EPINEPHrine 0.3 mg/0.3 mL IJ SOAJ injection Inject 0.3 mg into the muscle once.    rescue  . HYDROcodone-acetaminophen (NORCO) 10-325 MG tablet Take 0.5-1 tablets by mouth every 6 (six) hours as needed for moderate pain.   10/14/2016 at Unknown time  . loratadine-pseudoephedrine (CLARITIN-D 24-HOUR) 10-240 MG per 24 hr tablet Take 1 tablet by mouth daily.   10/14/2016 at Unknown time  . Pediatric Multiple Vit-C-FA (FLINSTONES GUMMIES OMEGA-3 DHA PO) Take 2 tablets by mouth daily.   10/15/2016 at Unknown time  . ZOMIG 5 MG nasal solution Take 5 mg by mouth as needed for migraine.    10/15/2016 at Unknown time    Review of Systems  Constitutional: Negative for chills and fever.  Eyes: Positive for photophobia. Negative for blurred vision.  Gastrointestinal: Positive for nausea and vomiting. Negative for abdominal pain.   Physical Exam   Blood pressure 126/81, pulse 81, temperature 98.2 F (36.8 C), temperature source Oral,  resp. rate 18, unknown if currently breastfeeding.  Physical Exam  Nursing note and vitals reviewed. Constitutional: She is oriented to person, place, and time. She appears well-developed and well-nourished. No distress.  HENT:  Head: Normocephalic.  Eyes: Pupils are equal, round, and reactive to light.  Neck: Normal range of motion.  Cardiovascular: Normal rate.   Respiratory: Effort normal.  GI: Soft.  Musculoskeletal: Normal range of motion.  Neurological: She is alert and oriented to person, place, and time.  Skin: Skin is warm and dry.  Psychiatric: She has a normal mood and affect.    MAU Course  Procedures  FHR 158 bpm IV D5LR  - Benadryl 25mg  Reglan 10mg  and Decadron 10mg  IV relieved headache- pt tolerated PO fluids an  crackers Discussed with Dr. Langston MaskerMorris- pt may be discharged home Assessment and Plan  Headache in pregnancy relieved with headache cocktail Viable IUP 5956w0d Rx Fioricet Keep OB appointment  Kirtis Challis 10/15/2016, 8:06 PM

## 2016-10-15 NOTE — Discharge Instructions (Signed)

## 2016-10-15 NOTE — MAU Note (Signed)
Pt given crackers and Sprite for PO challenge

## 2016-10-30 DIAGNOSIS — Z3480 Encounter for supervision of other normal pregnancy, unspecified trimester: Secondary | ICD-10-CM | POA: Diagnosis not present

## 2016-10-30 DIAGNOSIS — Z3A15 15 weeks gestation of pregnancy: Secondary | ICD-10-CM | POA: Diagnosis not present

## 2016-11-19 DIAGNOSIS — Z363 Encounter for antenatal screening for malformations: Secondary | ICD-10-CM | POA: Diagnosis not present

## 2016-12-02 NOTE — L&D Delivery Note (Signed)
Delivery Note At 8:53 PM a viable female was delivered via Vaginal, Spontaneous Delivery OA Presentation  APGAR: 9, 9; weight pending  .   Placenta status:spontaneously with 3 vessel cord , .  Cord:  with the following complications:none .  Cord pH: not obtained  Anesthesia:  epidural Episiotomy: None Lacerations: 2nd degree Suture Repair: 3.0 chromic Est. Blood Loss (mL):  300  Mom to postpartum.  Baby to Couplet care / Skin to Skin.  Martin Belling L 04/18/2017, 9:10 PM

## 2016-12-16 DIAGNOSIS — Z3A21 21 weeks gestation of pregnancy: Secondary | ICD-10-CM | POA: Diagnosis not present

## 2016-12-16 DIAGNOSIS — Z362 Encounter for other antenatal screening follow-up: Secondary | ICD-10-CM | POA: Diagnosis not present

## 2017-01-14 DIAGNOSIS — D509 Iron deficiency anemia, unspecified: Secondary | ICD-10-CM | POA: Diagnosis not present

## 2017-01-14 DIAGNOSIS — Z3A26 26 weeks gestation of pregnancy: Secondary | ICD-10-CM | POA: Diagnosis not present

## 2017-01-14 DIAGNOSIS — Z3480 Encounter for supervision of other normal pregnancy, unspecified trimester: Secondary | ICD-10-CM | POA: Diagnosis not present

## 2017-01-28 DIAGNOSIS — L639 Alopecia areata, unspecified: Secondary | ICD-10-CM | POA: Diagnosis not present

## 2017-01-28 DIAGNOSIS — L659 Nonscarring hair loss, unspecified: Secondary | ICD-10-CM | POA: Diagnosis not present

## 2017-02-11 DIAGNOSIS — O3663X0 Maternal care for excessive fetal growth, third trimester, not applicable or unspecified: Secondary | ICD-10-CM | POA: Diagnosis not present

## 2017-02-11 DIAGNOSIS — Z3A30 30 weeks gestation of pregnancy: Secondary | ICD-10-CM | POA: Diagnosis not present

## 2017-02-26 DIAGNOSIS — Z3A32 32 weeks gestation of pregnancy: Secondary | ICD-10-CM | POA: Diagnosis not present

## 2017-02-26 DIAGNOSIS — Z23 Encounter for immunization: Secondary | ICD-10-CM | POA: Diagnosis not present

## 2017-02-26 DIAGNOSIS — O368190 Decreased fetal movements, unspecified trimester, not applicable or unspecified: Secondary | ICD-10-CM | POA: Diagnosis not present

## 2017-03-19 ENCOUNTER — Encounter (HOSPITAL_COMMUNITY): Payer: Self-pay

## 2017-03-19 ENCOUNTER — Inpatient Hospital Stay (HOSPITAL_COMMUNITY)
Admission: AD | Admit: 2017-03-19 | Discharge: 2017-03-20 | Disposition: A | Discharge status: Home / Self Care | Payer: BLUE CROSS/BLUE SHIELD | Source: Ambulatory Visit | Attending: Obstetrics and Gynecology | Admitting: Obstetrics and Gynecology

## 2017-03-19 ENCOUNTER — Inpatient Hospital Stay (HOSPITAL_COMMUNITY): Payer: BLUE CROSS/BLUE SHIELD

## 2017-03-19 DIAGNOSIS — O26893 Other specified pregnancy related conditions, third trimester: Secondary | ICD-10-CM | POA: Diagnosis not present

## 2017-03-19 DIAGNOSIS — O4703 False labor before 37 completed weeks of gestation, third trimester: Secondary | ICD-10-CM | POA: Diagnosis not present

## 2017-03-19 DIAGNOSIS — O9989 Other specified diseases and conditions complicating pregnancy, childbirth and the puerperium: Secondary | ICD-10-CM

## 2017-03-19 DIAGNOSIS — Z3A35 35 weeks gestation of pregnancy: Secondary | ICD-10-CM | POA: Diagnosis not present

## 2017-03-19 DIAGNOSIS — O368130 Decreased fetal movements, third trimester, not applicable or unspecified: Secondary | ICD-10-CM | POA: Diagnosis not present

## 2017-03-19 DIAGNOSIS — Z87891 Personal history of nicotine dependence: Secondary | ICD-10-CM | POA: Insufficient documentation

## 2017-03-19 DIAGNOSIS — O479 False labor, unspecified: Secondary | ICD-10-CM

## 2017-03-19 DIAGNOSIS — N898 Other specified noninflammatory disorders of vagina: Secondary | ICD-10-CM | POA: Diagnosis not present

## 2017-03-19 LAB — URINALYSIS, ROUTINE W REFLEX MICROSCOPIC
Bilirubin Urine: NEGATIVE
Glucose, UA: NEGATIVE mg/dL
HGB URINE DIPSTICK: NEGATIVE
Ketones, ur: NEGATIVE mg/dL
LEUKOCYTES UA: NEGATIVE
Nitrite: NEGATIVE
PROTEIN: NEGATIVE mg/dL
SPECIFIC GRAVITY, URINE: 1.025 (ref 1.005–1.030)
pH: 5 (ref 5.0–8.0)

## 2017-03-19 LAB — POCT FERN TEST: POCT Fern Test: NEGATIVE

## 2017-03-19 NOTE — MAU Note (Signed)
Pt here with c/o abdominal pain and some leaking of fluid. Denies any bleeding. Reports decreased fetal movement.

## 2017-03-19 NOTE — MAU Provider Note (Signed)
History     CSN: [id number]  Arrival date and time: 03/19/17 2115  First Provider Initiated Contact with Patient 03/19/17 2223    Chief Complaint  Patient presents with  . Abdominal Pain  . Rupture of Membranes  . Decreased Fetal Movement   HPI [name] is a 36 y.o. G3P1011 at [redacted]w[redacted]d who presents with abdominal pain, leaking of fluid, & decreased fetal movement.  Describes intermittent lower abdominal pain & abdominal tightening since this morning that feels "different that braxton hicks". Rates pain 5/10. Pain occurs 2-3 times per hour. Has also noted decreased fetal movement today; stating prior to coming here she has only felt baby move twice today.  Had a gush of discharge this evening that left her underwear wet. Couldn't tell what discharge looked like and it has not continued. Denies vaginal bleeding, n/v/d, constipation, or dysuria.   OB History    Gravida Para Term Preterm AB Living   SAB TAB Ectopic Multiple Live Births   1       1      Past Medical History:  Diagnosis Date  . AMA (advanced maternal age) primigravida 35+   . Anxiety   . Back pain, chronic   . Depression   . Headache(784.0)   . Hx of endometriosis   . Vaginal Pap smear, abnormal     Past Surgical History:  Procedure Laterality Date  . CHOLECYSTECTOMY    . DILATION AND CURETTAGE OF UTERUS    . DILATION AND CURETTAGE OF UTERUS    . DILATION AND EVACUATION    . LAPAROSCOPY    . Leep      Family History  Problem Relation Age of Onset  . Hypertension Father   . Cancer Sister     cervical  . Cancer Maternal Grandmother     bone  . Cancer Maternal Grandfather     brain and colon  . Diabetes Paternal Grandfather     Social History  Substance Use Topics  . Smoking status: Former Smoker    Quit date: 03/03/2014  . Smokeless tobacco: Never Used  . Alcohol use No    Allergies:  Allergies  Allergen Reactions  . Celebrex [Celecoxib] Hives    Pt is able to take  ibuprofen without problems  . Wellbutrin [Bupropion] Hives    Prescriptions Prior to Admission  Medication Sig Dispense Refill Last Dose  . acetaminophen (TYLENOL) 325 MG tablet Take 650 mg by mouth every 6 (six) hours as needed for moderate pain.   Past Week at Unknown time  . calcium carbonate (TUMS - DOSED IN MG ELEMENTAL CALCIUM) 500 MG chewable tablet Chew 2 tablets by mouth daily as needed for indigestion or heartburn.   03/19/2017 at Unknown time  . cyclobenzaprine (FLEXERIL) 10 MG tablet Take 10 mg by mouth 3 (three) times daily as needed for muscle spasms.    03/18/2017 at Unknown time  . diphenhydrAMINE (BENADRYL) 12.5 MG/5ML liquid Take 25 mg by mouth daily as needed for allergies.   Past Month at Unknown time  . Pediatric Multiple Vit-C-FA (FLINSTONES GUMMIES OMEGA-3 DHA PO) Take 2 tablets by mouth daily.   03/18/2017 at Unknown time  . zolpidem (AMBIEN) 5 MG tablet Take 5 mg by mouth at bedtime as needed for sleep.    Past Week at Unknown time  . butalbital-acetaminophen-caffeine (FIORICET, ESGIC) 50-325-40 MG tablet Take 1-2 tablets by mouth every 6 (six) hours as needed  for headache. (Patient not taking: Reported on 03/19/2017) 20 tablet 0 Not Taking at Unknown time  . EPINEPHrine 0.3 mg/0.3 mL IJ SOAJ injection Inject 0.3 mg into the muscle once.    Emergency  . promethazine (PHENERGAN) 25 MG tablet Take 1 tablet (25 mg total) by mouth every 6 (six) hours as needed for nausea or vomiting. (Patient not taking: Reported on 03/19/2017) 30 tablet 0 Not Taking at Unknown time    Review of Systems  Constitutional: Negative.   Gastrointestinal: Positive for abdominal pain. Negative for constipation, diarrhea, nausea and vomiting.  Genitourinary: Positive for vaginal discharge. Negative for dysuria and vaginal bleeding.   Physical Exam   Blood pressure (!) 116/56, pulse 98, temperature 98.1 F (36.7 C), temperature source Oral, resp. rate 18, height  (1.676 m), weight 288 lb (130.6  kg), SpO2 98 %, unknown if currently breastfeeding.  Physical Exam  Nursing note and vitals reviewed. Constitutional: She is oriented to person, place, and time. She appears well-developed and well-nourished. No distress.  HENT:  Head: Normocephalic and atraumatic.  Eyes: Conjunctivae are normal. Right eye exhibits no discharge. Left eye exhibits no discharge. No scleral icterus.  Neck: Normal range of motion.  Cardiovascular: Normal rate, regular rhythm and normal heart sounds.   No murmur heard. Respiratory: Effort normal and breath sounds normal. No respiratory distress. She has no wheezes.  GI: Soft. There is no tenderness.  Genitourinary: No bleeding in the vagina. Vaginal discharge found.  Genitourinary Comments: No pooling  Neurological: She is alert and oriented to person, place, and time.  Skin: Skin is warm and dry. She is not diaphoretic.  Psychiatric: She has a normal mood and affect. Her behavior is normal. Judgment and thought content normal.   Dilation: Closed Exam by:: Judeth Horn NP   Fetal Tracing:  Baseline: 135 Variability: moderate Accelerations: 15x15 Decelerations: small variable  Toco: 3-8 min & UI MAU Course  Procedures Results for orders placed or performed during the hospital encounter of 03/19/17 (from the past 24 hour(s))  Urinalysis, Routine w reflex microscopic     Status: Abnormal   Collection Time: 03/19/17  9:35 PM  Result Value Ref Range   Color, Urine YELLOW YELLOW   APPearance HAZY (A) CLEAR   Specific Gravity, Urine 1.025 1.005 - 1.030   pH 5.0 5.0 - 8.0   Glucose, UA NEGATIVE NEGATIVE mg/dL   Hgb urine dipstick NEGATIVE NEGATIVE   Bilirubin Urine NEGATIVE NEGATIVE   Ketones, ur NEGATIVE NEGATIVE mg/dL   Protein, ur NEGATIVE NEGATIVE mg/dL   Nitrite NEGATIVE NEGATIVE   Leukocytes, UA NEGATIVE NEGATIVE  POCT fern test     Status: None   Collection Time: 03/19/17 10:36 PM  Result Value Ref Range   POCT Fern Test Negative = intact  amniotic membranes     MDM Pt reports improvement in fetal movement since being in MAU Reactive fetal tracing with few small variable decels Cervix closed No pooling & fern negative BPP 8/8 with normal AFI & reactive tracing without decels after pt returned from ultrasound S/w Dr. Renaldo Fiddler. Ok to discharge home  Assessment and Plan  A; 1. Vaginal discharge during pregnancy in third trimester   2. [redacted] weeks gestation of pregnancy   3. Decreased fetal movement affecting management of pregnancy in third trimester, single or unspecified fetus   4. False labor    P: Discharge home Fetal kick counts Discussed reasons to return to MAU Keep f/u with OB  Judeth Horn 03/19/2017, 10:22  PM  

## 2017-03-19 NOTE — Discharge Instructions (Signed)
Braxton Hicks Contractions °Contractions of the uterus can occur throughout pregnancy, but they are not always a sign that you are in labor. You may have practice contractions called Braxton Hicks contractions. These false labor contractions are sometimes confused with true labor. °What are Braxton Hicks contractions? °Braxton Hicks contractions are tightening movements that occur in the muscles of the uterus before labor. Unlike true labor contractions, these contractions do not result in opening (dilation) and thinning of the cervix. Toward the end of pregnancy (32-34 weeks), Braxton Hicks contractions can happen more often and may become stronger. These contractions are sometimes difficult to tell apart from true labor because they can be very uncomfortable. You should not feel embarrassed if you go to the hospital with false labor. °Sometimes, the only way to tell if you are in true labor is for your health care provider to look for changes in the cervix. The health care provider will do a physical exam and may monitor your contractions. If you are not in true labor, the exam should show that your cervix is not dilating and your water has not broken. °If there are no prenatal problems or other health problems associated with your pregnancy, it is completely safe for you to be sent home with false labor. You may continue to have Braxton Hicks contractions until you go into true labor. °How can I tell the difference between true labor and false labor? °· Differences °¨ False labor °¨ Contractions last 30-70 seconds.: Contractions are usually shorter and not as strong as true labor contractions. °¨ Contractions become very regular.: Contractions are usually irregular. °¨ Discomfort is usually felt in the top of the uterus, and it spreads to the lower abdomen and low back.: Contractions are often felt in the front of the lower abdomen and in the groin. °¨ Contractions do not go away with walking.: Contractions may  go away when you walk around or change positions while lying down. °¨ Contractions usually become more intense and increase in frequency.: Contractions get weaker and are shorter-lasting as time goes on. °¨ The cervix dilates and gets thinner.: The cervix usually does not dilate or become thin. °Follow these instructions at home: °¨ Take over-the-counter and prescription medicines only as told by your health care provider. °¨ Keep up with your usual exercises and follow other instructions from your health care provider. °¨ Eat and drink lightly if you think you are going into labor. °¨ If Braxton Hicks contractions are making you uncomfortable: °¨ Change your position from lying down or resting to walking, or change from walking to resting. °¨ Sit and rest in a tub of warm water. °¨ Drink enough fluid to keep your urine clear or pale yellow. Dehydration may cause these contractions. °¨ Do slow and deep breathing several times an hour. °¨ Keep all follow-up prenatal visits as told by your health care provider. This is important. °Contact a health care provider if: °¨ You have a fever. °¨ You have continuous pain in your abdomen. °Get help right away if: °¨ Your contractions become stronger, more regular, and closer together. °¨ You have fluid leaking or gushing from your vagina. °¨ You pass blood-tinged mucus (bloody show). °¨ You have bleeding from your vagina. °¨ You have low back pain that you never had before. °¨ You feel your baby’s head pushing down and causing pelvic pressure. °¨ Your baby is not moving inside you as much as it used to. °Summary °¨ Contractions that occur before labor are   called Braxton Hicks contractions, false labor, or practice contractions. °¨ Braxton Hicks contractions are usually shorter, weaker, farther apart, and less regular than true labor contractions. True labor contractions usually become progressively stronger and regular and they become more frequent. °¨ Manage discomfort from  Braxton Hicks contractions by changing position, resting in a warm bath, drinking plenty of water, or practicing deep breathing. °This information is not intended to replace advice given to you by your health care provider. Make sure you discuss any questions you have with your health care provider. °Document Released: 11/18/2005 Document Revised: 10/07/2016 Document Reviewed: 10/07/2016 °Elsevier Interactive Patient Education © 2017 [employer] °Fetal Movement Counts °Patient Name: ________________________________________________ Patient Due Date: ____________________ °What is a fetal movement count? °A fetal movement count is the number of times that you feel your baby move during a certain amount of time. This may also be called a fetal kick count. A fetal movement count is recommended for every pregnant woman. You may be asked to start counting fetal movements as early as week 28 of your pregnancy. °Pay attention to when your baby is most active. You may notice your baby's sleep and wake cycles. You may also notice things that make your baby move more. You should do a fetal movement count: °· When your baby is normally most active. °· At the same time each day. °A good time to count movements is while you are resting, after having something to eat and drink. °How do I count fetal movements? °1. Find a quiet, comfortable area. Sit, or lie down on your side. °2. Write down the date, the start time and stop time, and the number of movements that you felt between those two times. Take this information with you to your health care visits. °3. For 2 hours, count kicks, flutters, swishes, rolls, and jabs. You should feel at least 10 movements during 2 hours. °4. You may stop counting after you have felt 10 movements. °5. If you do not feel 10 movements in 2 hours, have something to eat and drink. Then, keep resting and counting for 1 hour. If you feel at least 4 movements during that hour, you may stop  counting. °Contact a health care provider if: °· You feel fewer than 4 movements in 2 hours. °· Your baby is not moving like he or she usually does. °Date: ____________ Start time: ____________ Stop time: ____________ Movements: ____________ °Date: ____________ Start time: ____________ Stop time: ____________ Movements: ____________ °Date: ____________ Start time: ____________ Stop time: ____________ Movements: ____________ °Date: ____________ Start time: ____________ Stop time: ____________ Movements: ____________ °Date: ____________ Start time: ____________ Stop time: ____________ Movements: ____________ °Date: ____________ Start time: ____________ Stop time: ____________ Movements: ____________ °Date: ____________ Start time: ____________ Stop time: ____________ Movements: ____________ °Date: ____________ Start time: ____________ Stop time: ____________ Movements: ____________ °Date: ____________ Start time: ____________ Stop time: ____________ Movements: ____________ °This information is not intended to replace advice given to you by your health care provider. Make sure you discuss any questions you have with your health care provider. °Document Released: 12/18/2006 Document Revised: 07/17/2016 Document Reviewed: 12/28/2015 °Elsevier Interactive Patient Education © 2017 [employer] ° °

## 2017-03-20 DIAGNOSIS — O368130 Decreased fetal movements, third trimester, not applicable or unspecified: Secondary | ICD-10-CM | POA: Diagnosis not present

## 2017-03-20 DIAGNOSIS — Z3A35 35 weeks gestation of pregnancy: Secondary | ICD-10-CM | POA: Diagnosis not present

## 2017-03-28 DIAGNOSIS — Z3685 Encounter for antenatal screening for Streptococcus B: Secondary | ICD-10-CM | POA: Diagnosis not present

## 2017-03-28 DIAGNOSIS — Z36 Encounter for antenatal screening for chromosomal anomalies: Secondary | ICD-10-CM | POA: Diagnosis not present

## 2017-03-28 LAB — OB RESULTS CONSOLE GBS: STREP GROUP B AG: NEGATIVE

## 2017-04-04 DIAGNOSIS — Z3A37 37 weeks gestation of pregnancy: Secondary | ICD-10-CM | POA: Diagnosis not present

## 2017-04-04 DIAGNOSIS — O3663X0 Maternal care for excessive fetal growth, third trimester, not applicable or unspecified: Secondary | ICD-10-CM | POA: Diagnosis not present

## 2017-04-09 DIAGNOSIS — Z3A38 38 weeks gestation of pregnancy: Secondary | ICD-10-CM | POA: Diagnosis not present

## 2017-04-09 DIAGNOSIS — O9989 Other specified diseases and conditions complicating pregnancy, childbirth and the puerperium: Secondary | ICD-10-CM | POA: Diagnosis not present

## 2017-04-15 ENCOUNTER — Telehealth (HOSPITAL_COMMUNITY): Payer: Self-pay | Admitting: *Deleted

## 2017-04-18 ENCOUNTER — Encounter (HOSPITAL_COMMUNITY): Payer: Self-pay

## 2017-04-18 ENCOUNTER — Inpatient Hospital Stay (HOSPITAL_COMMUNITY): Payer: BLUE CROSS/BLUE SHIELD | Admitting: Anesthesiology

## 2017-04-18 ENCOUNTER — Inpatient Hospital Stay (HOSPITAL_COMMUNITY)
Admission: RE | Admit: 2017-04-18 | Discharge: 2017-04-20 | DRG: 775 | Disposition: A | Discharge status: Home / Self Care | Payer: BLUE CROSS/BLUE SHIELD | Source: Ambulatory Visit | Attending: Obstetrics and Gynecology | Admitting: Obstetrics and Gynecology

## 2017-04-18 VITALS — BP 125/72 | HR 65 | Temp 97.7°F | Resp 18 | Ht 66.0 in | Wt 291.0 lb

## 2017-04-18 DIAGNOSIS — O99214 Obesity complicating childbirth: Secondary | ICD-10-CM | POA: Diagnosis not present

## 2017-04-18 DIAGNOSIS — Z87891 Personal history of nicotine dependence: Secondary | ICD-10-CM

## 2017-04-18 DIAGNOSIS — Z3A39 39 weeks gestation of pregnancy: Secondary | ICD-10-CM

## 2017-04-18 DIAGNOSIS — Z6841 Body Mass Index (BMI) 40.0 and over, adult: Secondary | ICD-10-CM | POA: Diagnosis not present

## 2017-04-18 DIAGNOSIS — Z3490 Encounter for supervision of normal pregnancy, unspecified, unspecified trimester: Secondary | ICD-10-CM

## 2017-04-18 DIAGNOSIS — Z3493 Encounter for supervision of normal pregnancy, unspecified, third trimester: Secondary | ICD-10-CM | POA: Diagnosis not present

## 2017-04-18 HISTORY — DX: Unspecified injury of lower back, initial encounter: S39.92XA

## 2017-04-18 HISTORY — DX: Other intervertebral disc displacement, lumbar region: M51.26

## 2017-04-18 LAB — TYPE AND SCREEN
ABO/RH(D): O POS
ANTIBODY SCREEN: NEGATIVE

## 2017-04-18 LAB — CBC
HEMATOCRIT: 35.6 % — AB (ref 36.0–46.0)
Hemoglobin: 11.9 g/dL — ABNORMAL LOW (ref 12.0–15.0)
MCH: 29.5 pg (ref 26.0–34.0)
MCHC: 33.4 g/dL (ref 30.0–36.0)
MCV: 88.1 fL (ref 78.0–100.0)
Platelets: 266 10*3/uL (ref 150–400)
RBC: 4.04 MIL/uL (ref 3.87–5.11)
RDW: 14.3 % (ref 11.5–15.5)
WBC: 17.7 10*3/uL — ABNORMAL HIGH (ref 4.0–10.5)

## 2017-04-18 MED ORDER — OXYCODONE-ACETAMINOPHEN 5-325 MG PO TABS: 2.0000 | ORAL_TABLET | ORAL | Status: DC | PRN

## 2017-04-18 MED ORDER — DIPHENHYDRAMINE HCL 50 MG/ML IJ SOLN: 12.5000 mg | INTRAMUSCULAR | Status: DC | PRN

## 2017-04-18 MED ORDER — LIDOCAINE HCL (PF) 1 % IJ SOLN
30.0000 mL | INTRAMUSCULAR | Status: DC | PRN
  Filled 2017-04-18: qty 30

## 2017-04-18 MED ORDER — EPHEDRINE 5 MG/ML INJ: 10.0000 mg | INTRAVENOUS | Status: DC | PRN

## 2017-04-18 MED ORDER — OXYCODONE-ACETAMINOPHEN 5-325 MG PO TABS
1.0000 | ORAL_TABLET | ORAL | Status: DC | PRN
  Administered 2017-04-19 – 2017-04-20 (×4): 1 via ORAL
  Filled 2017-04-18 (×4): qty 1

## 2017-04-18 MED ORDER — DIPHENHYDRAMINE HCL 25 MG PO CAPS
25.0000 mg | ORAL_CAPSULE | Freq: Four times a day (QID) | ORAL | Status: DC | PRN
  Administered 2017-04-18: 25 mg via ORAL
  Filled 2017-04-18: qty 1

## 2017-04-18 MED ORDER — PHENYLEPHRINE 40 MCG/ML (10ML) SYRINGE FOR IV PUSH (FOR BLOOD PRESSURE SUPPORT): 80.0000 ug | PREFILLED_SYRINGE | INTRAVENOUS | Status: DC | PRN

## 2017-04-18 MED ORDER — TETANUS-DIPHTH-ACELL PERTUSSIS 5-2.5-18.5 LF-MCG/0.5 IM SUSP: 0.5000 mL | Freq: Once | INTRAMUSCULAR | Status: DC

## 2017-04-18 MED ORDER — ZOLPIDEM TARTRATE 5 MG PO TABS: 5.0000 mg | ORAL_TABLET | Freq: Every evening | ORAL | Status: DC | PRN

## 2017-04-18 MED ORDER — PRENATAL MULTIVITAMIN CH
1.0000 | ORAL_TABLET | Freq: Every day | ORAL | Status: DC
  Administered 2017-04-19: 1 via ORAL
  Filled 2017-04-18: qty 1

## 2017-04-18 MED ORDER — MEDROXYPROGESTERONE ACETATE 150 MG/ML IM SUSP: 150.0000 mg | INTRAMUSCULAR | Status: DC | PRN

## 2017-04-18 MED ORDER — OXYTOCIN BOLUS FROM INFUSION: 500.0000 mL | Freq: Once | INTRAVENOUS | Status: DC

## 2017-04-18 MED ORDER — ACETAMINOPHEN 325 MG PO TABS: 650.0000 mg | ORAL_TABLET | ORAL | Status: DC | PRN

## 2017-04-18 MED ORDER — TERBUTALINE SULFATE 1 MG/ML IJ SOLN: 0.2500 mg | Freq: Once | INTRAMUSCULAR | Status: DC | PRN

## 2017-04-18 MED ORDER — PHENYLEPHRINE 40 MCG/ML (10ML) SYRINGE FOR IV PUSH (FOR BLOOD PRESSURE SUPPORT)
80.0000 ug | PREFILLED_SYRINGE | INTRAVENOUS | Status: DC | PRN
  Filled 2017-04-18: qty 10

## 2017-04-18 MED ORDER — MEASLES, MUMPS & RUBELLA VAC ~~LOC~~ INJ: 0.5000 mL | INJECTION | Freq: Once | SUBCUTANEOUS | Status: DC

## 2017-04-18 MED ORDER — OXYCODONE-ACETAMINOPHEN 5-325 MG PO TABS
2.0000 | ORAL_TABLET | ORAL | Status: DC | PRN
  Administered 2017-04-19: 2 via ORAL
  Filled 2017-04-18: qty 2

## 2017-04-18 MED ORDER — SOD CITRATE-CITRIC ACID 500-334 MG/5ML PO SOLN: 30.0000 mL | ORAL | Status: DC | PRN

## 2017-04-18 MED ORDER — SENNOSIDES-DOCUSATE SODIUM 8.6-50 MG PO TABS
2.0000 | ORAL_TABLET | ORAL | Status: DC
  Administered 2017-04-18 – 2017-04-20 (×2): 2 via ORAL
  Filled 2017-04-18 (×2): qty 2

## 2017-04-18 MED ORDER — BENZOCAINE-MENTHOL 20-0.5 % EX AERO
1.0000 "application " | INHALATION_SPRAY | CUTANEOUS | Status: DC | PRN
  Administered 2017-04-18 – 2017-04-20 (×2): 1 via TOPICAL
  Filled 2017-04-18 (×2): qty 56

## 2017-04-18 MED ORDER — COCONUT OIL OIL: 1.0000 "application " | TOPICAL_OIL | Status: DC | PRN

## 2017-04-18 MED ORDER — BUTORPHANOL TARTRATE 1 MG/ML IJ SOLN
1.0000 mg | Freq: Once | INTRAMUSCULAR | Status: AC
  Administered 2017-04-18: 1 mg via INTRAVENOUS
  Filled 2017-04-18: qty 1

## 2017-04-18 MED ORDER — ONDANSETRON HCL 4 MG/2ML IJ SOLN
4.0000 mg | Freq: Four times a day (QID) | INTRAMUSCULAR | Status: DC | PRN
  Administered 2017-04-18: 4 mg via INTRAVENOUS
  Filled 2017-04-18: qty 2

## 2017-04-18 MED ORDER — BUTORPHANOL TARTRATE 1 MG/ML IJ SOLN
2.0000 mg | Freq: Once | INTRAMUSCULAR | Status: AC
  Administered 2017-04-18: 2 mg via INTRAVENOUS
  Filled 2017-04-18: qty 2

## 2017-04-18 MED ORDER — WITCH HAZEL-GLYCERIN EX PADS
1.0000 "application " | MEDICATED_PAD | CUTANEOUS | Status: DC | PRN
  Administered 2017-04-20: 1 via TOPICAL

## 2017-04-18 MED ORDER — FENTANYL 2.5 MCG/ML BUPIVACAINE 1/10 % EPIDURAL INFUSION (WH - ANES)
14.0000 mL/h | INTRAMUSCULAR | Status: DC | PRN
  Administered 2017-04-18 (×2): 14 mL/h via EPIDURAL
  Filled 2017-04-18 (×2): qty 100

## 2017-04-18 MED ORDER — LACTATED RINGERS IV SOLN: 500.0000 mL | INTRAVENOUS | Status: DC | PRN

## 2017-04-18 MED ORDER — BISACODYL 10 MG RE SUPP
10.0000 mg | Freq: Every day | RECTAL | Status: DC | PRN
  Filled 2017-04-18: qty 1

## 2017-04-18 MED ORDER — LACTATED RINGERS IV SOLN
INTRAVENOUS | Status: DC
  Administered 2017-04-18 (×4): via INTRAVENOUS

## 2017-04-18 MED ORDER — LACTATED RINGERS IV SOLN
500.0000 mL | Freq: Once | INTRAVENOUS | Status: AC
  Administered 2017-04-18: 500 mL via INTRAVENOUS

## 2017-04-18 MED ORDER — OXYTOCIN 40 UNITS IN LACTATED RINGERS INFUSION - SIMPLE MED: 2.5000 [IU]/h | INTRAVENOUS | Status: DC

## 2017-04-18 MED ORDER — ONDANSETRON HCL 4 MG PO TABS: 4.0000 mg | ORAL_TABLET | ORAL | Status: DC | PRN

## 2017-04-18 MED ORDER — SIMETHICONE 80 MG PO CHEW: 80.0000 mg | CHEWABLE_TABLET | ORAL | Status: DC | PRN

## 2017-04-18 MED ORDER — DIBUCAINE 1 % RE OINT: 1.0000 "application " | TOPICAL_OINTMENT | RECTAL | Status: DC | PRN

## 2017-04-18 MED ORDER — IBUPROFEN 600 MG PO TABS
600.0000 mg | ORAL_TABLET | Freq: Four times a day (QID) | ORAL | Status: DC
  Administered 2017-04-18 – 2017-04-20 (×6): 600 mg via ORAL
  Filled 2017-04-18 (×6): qty 1

## 2017-04-18 MED ORDER — MISOPROSTOL 25 MCG QUARTER TABLET
25.0000 ug | ORAL_TABLET | ORAL | Status: DC | PRN
  Administered 2017-04-18 (×2): 25 ug via VAGINAL
  Filled 2017-04-18 (×2): qty 1

## 2017-04-18 MED ORDER — OXYTOCIN 40 UNITS IN LACTATED RINGERS INFUSION - SIMPLE MED
1.0000 m[IU]/min | INTRAVENOUS | Status: DC
  Administered 2017-04-18: 2 m[IU]/min via INTRAVENOUS
  Administered 2017-04-18: 666 m[IU]/min via INTRAVENOUS
  Filled 2017-04-18: qty 1000

## 2017-04-18 MED ORDER — FLEET ENEMA 7-19 GM/118ML RE ENEM: 1.0000 | ENEMA | Freq: Every day | RECTAL | Status: DC | PRN

## 2017-04-18 MED ORDER — LIDOCAINE HCL (PF) 1 % IJ SOLN
INTRAMUSCULAR | Status: DC | PRN
  Administered 2017-04-18 (×2): 5 mL

## 2017-04-18 MED ORDER — ONDANSETRON HCL 4 MG/2ML IJ SOLN: 4.0000 mg | INTRAMUSCULAR | Status: DC | PRN

## 2017-04-18 MED ORDER — OXYCODONE-ACETAMINOPHEN 5-325 MG PO TABS: 1.0000 | ORAL_TABLET | ORAL | Status: DC | PRN

## 2017-04-18 NOTE — Anesthesia Procedure Notes (Signed)
Epidural Patient location during procedure: OB  Staffing Anesthesiologist: Kortne All Performed: anesthesiologist   Preanesthetic Checklist Completed: patient identified, site marked, surgical consent, pre-op evaluation, timeout performed, IV checked, risks and benefits discussed and monitors and equipment checked  Epidural Patient position: sitting Prep: DuraPrep Patient monitoring: heart rate, continuous pulse ox and blood pressure Approach: midline Location: L3-L4 Injection technique: LOR saline  Needle:  Needle type: Tuohy  Needle gauge: 17 G Needle length: 9 cm and 9 Needle insertion depth: 7 cm Catheter type: closed end flexible Catheter size: 20 Guage Catheter at skin depth: 12 cm Test dose: negative  Assessment Events: blood not aspirated, injection not painful, no injection resistance, negative IV test and no paresthesia  Additional Notes Patient identified. Risks/Benefits/Options discussed with patient including but not limited to bleeding, infection, nerve damage, paralysis, failed block, incomplete pain control, headache, blood pressure changes, nausea, vomiting, reactions to medication both or allergic, itching and postpartum back pain. Confirmed with bedside nurse the patient's most recent platelet count. Confirmed with patient that they are not currently taking any anticoagulation, have any bleeding history or any family history of bleeding disorders. Patient expressed understanding and wished to proceed. All questions were answered. Sterile technique was used throughout the entire procedure. Please see nursing notes for vital signs. Test dose was given through epidural needle and negative prior to continuing to dose epidural or start infusion. Warning signs of high block given to the patient including shortness of breath, tingling/numbness in hands, complete motor block, or any concerning symptoms with instructions to call for help. Patient was given instructions  on fall risk and not to get out of bed. All questions and concerns addressed with instructions to call with any issues.     

## 2017-04-18 NOTE — Anesthesia Pain Management Evaluation Note (Signed)
  CRNA Pain Management Visit Note  Patient: [name], 36 y.o., female  "Hello I am a [facility] of the anesthesia [facility]. We have an anesthesia team available at all times to provide care throughout the hospital, including epidural management and anesthesia for C-section. I don't know your plan for the delivery whether it a natural birth, water birth, IV sedation, nitrous supplementation, doula or epidural, but we want to meet your pain goals."   1.Was your pain managed to your expectations on prior hospitalizations?   Yes   2.What is your expectation for pain management during this hospitalization?     Epidural  3.How can we help you reach that goal? Epidural when ready  Record the patient's initial score and the patient's pain goal.   Pain: 6  Pain Goal: 3 [facility] wants you to be able to say your pain was always managed very well.  [facility],[name] 04/18/2017

## 2017-04-18 NOTE — Anesthesia Preprocedure Evaluation (Signed)
Anesthesia Evaluation  Patient identified by MRN, date of birth, ID band Patient awake    Reviewed: Allergy & Precautions, H&P , NPO status , Patient's Chart, lab work & pertinent test results  History of Anesthesia Complications Negative for: history of anesthetic complications  Airway Mallampati: III  TM Distance: >3 FB Neck ROM: full    Dental no notable dental hx. (+) Teeth Intact   Pulmonary neg pulmonary ROS, former smoker,    Pulmonary exam normal breath sounds clear to auscultation       Cardiovascular negative cardio ROS Normal cardiovascular exam Rhythm:regular Rate:Normal     Neuro/Psych negative neurological ROS  negative psych ROS   GI/Hepatic negative GI ROS, Neg liver ROS,   Endo/Other  negative endocrine ROSMorbid obesity  Renal/GU negative Renal ROS  negative genitourinary   Musculoskeletal   Abdominal   Peds  Hematology negative hematology ROS (+)   Anesthesia Other Findings   Reproductive/Obstetrics (+) Pregnancy                             Anesthesia Physical Anesthesia Plan  ASA: III  Anesthesia Plan: Epidural   Post-op Pain Management:    Induction:   Airway Management Planned:   Additional Equipment:   Intra-op Plan:   Post-operative Plan:   Informed Consent: I have reviewed the patients History and Physical, chart, labs and discussed the procedure including the risks, benefits and alternatives for the proposed anesthesia with the patient or authorized representative who has indicated his/her understanding and acceptance.     Plan Discussed with:   Anesthesia Plan Comments:         Anesthesia Quick Evaluation

## 2017-04-18 NOTE — H&P (Signed)
[name] is a 36 y.o. G 3 P 1 at 2839 w 3 day presents for induction Received cytotec last night OB History    Gravida Para Term Preterm AB Living   3 1 1   1 1    SAB TAB Ectopic Multiple Live Births   1       1     Past Medical History:  Diagnosis Date  . AMA (advanced maternal age) primigravida 35+   . Anxiety   . Back pain, chronic   . Depression   . Headache(784.0)   . Hx of endometriosis   . Injury of coccyx   . Ruptured lumbar disc    L4 L5  . Vaginal Pap smear, abnormal    Past Surgical History:  Procedure Laterality Date  . CHOLECYSTECTOMY    . DILATION AND CURETTAGE OF UTERUS    . DILATION AND CURETTAGE OF UTERUS    . DILATION AND EVACUATION    . LAPAROSCOPY    . Leep     Family History: family history includes Cancer in her maternal grandfather, maternal grandmother, and sister; Diabetes in her paternal grandfather; Hypertension in her father. Social History:  reports that she quit smoking about 3 years ago. She has never used smokeless tobacco. She reports that she does not drink alcohol or use drugs.     Maternal Diabetes: No Genetic Screening: Normal Maternal Ultrasounds/Referrals: Normal Fetal Ultrasounds or other Referrals:  None Maternal Substance Abuse:  No Significant Maternal Medications:  None Significant Maternal Lab Results:  None Other Comments:  None  Review of Systems  All other systems reviewed and are negative.  History Dilation: Fingertip Effacement (%): 50 Station: -2 Exam by:: [name] Blood pressure 115/63, pulse 79, temperature 98.4 F (36.9 C), temperature source Oral, resp. rate 16, height 5\' 6"  (1.676 m), weight 132 kg (291 lb), unknown if currently breastfeeding. Maternal Exam:  Abdomen: Fetal presentation: vertex     Fetal Exam Fetal State Assessment: Category I - tracings are normal.     Physical Exam  Nursing note and vitals reviewed. Constitutional: She appears well-developed and well-nourished.  HENT:   Head: Normocephalic.  Eyes: Pupils are equal, round, and reactive to light.  Neck: Normal range of motion.  Cardiovascular: Normal rate and regular rhythm.     Prenatal labs: ABO, Rh: --/--/O POS (05/18 [id number]) Antibody: NEG (05/18 0108) Rubella:   RPR:    HBsAg:    HIV:    GBS:     Assessment/Plan: IUP at term IOL Stadol now Start pitocin - discussed with patient   Brysan Mcevoy L 04/18/2017, 8:03 AM

## 2017-04-19 ENCOUNTER — Encounter (HOSPITAL_COMMUNITY): Payer: Self-pay

## 2017-04-19 LAB — RPR: RPR Ser Ql: NONREACTIVE

## 2017-04-19 LAB — CBC
HCT: 32.6 % — ABNORMAL LOW (ref 36.0–46.0)
Hemoglobin: 10.8 g/dL — ABNORMAL LOW (ref 12.0–15.0)
MCH: 29.5 pg (ref 26.0–34.0)
MCHC: 33.1 g/dL (ref 30.0–36.0)
MCV: 89.1 fL (ref 78.0–100.0)
PLATELETS: 237 10*3/uL (ref 150–400)
RBC: 3.66 MIL/uL — AB (ref 3.87–5.11)
RDW: 14.4 % (ref 11.5–15.5)
WBC: 16.6 10*3/uL — AB (ref 4.0–10.5)

## 2017-04-19 LAB — RUBELLA SCREEN: Rubella: 1.54 index (ref >0.99)

## 2017-04-19 NOTE — Progress Notes (Signed)
MOB was referred for history of depression/anxiety.  Referral is screened out by Clinical Social Worker because none of the following criteria appear to apply and there are no reports impacting the pregnancy or her transition to the postpartum period.  CSW does not deem it clinically necessary to further investigate at this time.   -History of anxiety/depression during this pregnancy, or of post-partum depression.  - Diagnosis of anxiety and/or depression within last 3 years.-  - History of depression due to pregnancy loss/loss of child or -MOB's symptoms are currently being treated with medication and/or therapy.  CSW met with MOB at bedside to complete assessment for consult regarding the above. MOB informed this Probation officer that she dealt with this 3 years ago after the birth of her son; however, denies dealing with it currently. MOB further notes that she was taking zoloft but has been off it for two years and does not desire to start it again. Please contact the Clinical Social Worker if needs arise or upon MOB request.    Oda Cogan, MSW, [facility]  Office: [phone]

## 2017-04-19 NOTE — Anesthesia Postprocedure Evaluation (Signed)
Anesthesia Post Note  Patient: [name]  Procedure(s) Performed: * No procedures listed *  Patient location during evaluation: Mother Baby Anesthesia Type: Epidural Level of consciousness: awake and alert and oriented Pain management: pain level controlled Vital Signs Assessment: post-procedure vital signs reviewed and stable Respiratory status: spontaneous breathing and nonlabored ventilation Cardiovascular status: stable Postop Assessment: no headache, no backache, epidural receding, patient able to bend at knees, no signs of nausea or vomiting and adequate PO intake Anesthetic complications: no        Last Vitals:  Vitals:   04/19/17 0000 04/19/17 0400  BP: 130/69 123/69  Pulse: 85 79  Resp: 18 18  Temp: 36.7 C 36.5 C    Last Pain:  Vitals:   04/19/17 0941  TempSrc:   PainSc: 5    Pain Goal: Patients Stated Pain Goal: 3 (04/19/17 0941)               Laban EmperorMalinova,Aslan Himes Hristova

## 2017-04-19 NOTE — Progress Notes (Signed)
Patient doing well.  BP 123/69   Pulse 79   Temp 97.7 F (36.5 C) (Oral)   Resp 18   Ht 5\' 6"  (1.676 m)   Wt 132 kg (291 lb)   SpO2 98%   Breastfeeding? Unknown   BMI 46.97 kg/m  Results for orders placed or performed during the hospital encounter of 04/18/17 (from the past 24 hour(s))  CBC     Status: Abnormal   Collection Time: 04/19/17  5:10 AM  Result Value Ref Range   WBC 16.6 (H) 4.0 - 10.5 K/uL   RBC 3.66 (L) 3.87 - 5.11 MIL/uL   Hemoglobin 10.8 (L) 12.0 - 15.0 g/dL   HCT 56.232.6 (L) 13.036.0 - 86.546.0 %   MCV 89.1 78.0 - 100.0 fL   MCH 29.5 26.0 - 34.0 pg   MCHC 33.1 30.0 - 36.0 g/dL   RDW 78.414.4 69.611.5 - 29.515.5 %   Platelets 237 150 - 400 K/uL   Abdomen is soft and non tender  PPD #1 doing well Routine care Discharge later tonight or tomorrow

## 2017-04-19 NOTE — Lactation Note (Signed)
This note was copied from a baby's chart. Lactation Consultation Note  Patient Name: [name] [id number]'[id number]'s Date: 04/19/2017 Reason for consult: Initial assessment Breastfeeding consultation services and support information given to mom.  This is her second baby and she pumped and bottlefed first for 6 weeks.  Mom has been pumping and hand expressing good amounts of colostrum .  She states she may decide to pump/bottle with this baby so she can have help.  Baby is cueing to feed.  Mom states she likes cradle hold best.   Colostrum easily hand expressed prior to attempt.   Baby latched off and on for a few minutes and then mom stopped and gave bottle of EBM when visitors arrived.  Mom concerned baby has lost 1 ounce.  Discussed normal weight loss for newborns.  Asking if formula should be started.  Recommend she continue cue based feedings at breast along with spoon feeding milk she pumps or hand expresses.  Encouraged to call for assist/concerns prn.  Maternal Data Has patient been taught Hand Expression?: Yes Does the patient have breastfeeding experience prior to this delivery?: Yes  Feeding Feeding Type: Breast Fed  LATCH Score/Interventions Latch: Repeated attempts needed to sustain latch, nipple held in mouth throughout feeding, stimulation needed to elicit sucking reflex.  Audible Swallowing: None Intervention(s): Hand expression;Alternate breast massage  Type of Nipple: Everted at rest and after stimulation  Comfort (Breast/Nipple): Soft / non-tender     Hold (Positioning): No assistance needed to correctly position infant at breast. Intervention(s): Breastfeeding basics reviewed;Support Pillows;Position options  LATCH Score: 7  Lactation Tools Discussed/Used     Consult Status Consult Status: Follow-up Date: 04/20/17 Follow-up type: In-patient    Huston FoleyMOULDEN, Pamela Cortright S 04/19/2017, 2:58 PM

## 2017-04-20 MED ORDER — IBUPROFEN 600 MG PO TABS: 600.0000 mg | ORAL_TABLET | Freq: Four times a day (QID) | ORAL | 0 refills | Status: AC

## 2017-04-20 MED ORDER — OXYCODONE-ACETAMINOPHEN 5-325 MG PO TABS: 1.0000 | ORAL_TABLET | ORAL | 0 refills | Status: AC | PRN

## 2017-04-20 NOTE — Discharge Summary (Signed)
Obstetric Discharge Summary Reason for Admission: induction of labor Prenatal Procedures: none Intrapartum Procedures: spontaneous vaginal delivery Postpartum Procedures: none Complications-Operative and Postpartum: 2nd degree perineal laceration Hemoglobin  Date Value Ref Range Status  04/19/2017 10.8 (Haney) 12.0 - 15.0 g/dL Final   HGB  Date Value Ref Range Status  06/18/2012 14.3 12.0 - 16.0 g/dL Final   HCT  Date Value Ref Range Status  04/19/2017 32.6 (Haney) 36.0 - 46.0 % Final  06/18/2012 42.5 35.0 - 47.0 % Final    Physical Exam:  General: alert, cooperative and appears stated age 42Lochia: appropriate Uterine Fundus: firm Incision: healing well, no significant drainage, no dehiscence DVT Evaluation: No evidence of DVT seen on physical exam.  Discharge Diagnoses: Term Pregnancy-delivered  Discharge Information: Date: 04/20/2017 Activity: pelvic rest Diet: routine Medications: Ibuprofen and Percocet Condition: improved Instructions: refer to practice specific booklet Discharge to: home   Newborn Data: Live born female  Birth Weight: 9 lb 2.2 oz (4145 g) APGAR: 9, 9  Home with mother.  [name] 04/20/2017, 7:23 AM

## 2017-05-02 DIAGNOSIS — M25512 Pain in left shoulder: Secondary | ICD-10-CM | POA: Diagnosis not present

## 2017-05-02 DIAGNOSIS — Z6841 Body Mass Index (BMI) 40.0 and over, adult: Secondary | ICD-10-CM | POA: Diagnosis not present

## 2017-05-27 DIAGNOSIS — Z1389 Encounter for screening for other disorder: Secondary | ICD-10-CM | POA: Diagnosis not present

## 2017-05-28 DIAGNOSIS — M722 Plantar fascial fibromatosis: Secondary | ICD-10-CM | POA: Diagnosis not present

## 2017-05-28 DIAGNOSIS — M79671 Pain in right foot: Secondary | ICD-10-CM | POA: Diagnosis not present

## 2017-06-12 ENCOUNTER — Other Ambulatory Visit: Payer: Self-pay | Admitting: Obstetrics and Gynecology

## 2017-06-12 DIAGNOSIS — Z302 Encounter for sterilization: Secondary | ICD-10-CM | POA: Diagnosis not present

## 2017-06-12 DIAGNOSIS — N83292 Other ovarian cyst, left side: Secondary | ICD-10-CM | POA: Diagnosis not present

## 2017-06-12 DIAGNOSIS — R102 Pelvic and perineal pain: Secondary | ICD-10-CM | POA: Diagnosis not present

## 2017-06-12 DIAGNOSIS — N803 Endometriosis of pelvic peritoneum: Secondary | ICD-10-CM | POA: Diagnosis not present

## 2017-06-12 DIAGNOSIS — N736 Female pelvic peritoneal adhesions (postinfective): Secondary | ICD-10-CM | POA: Diagnosis not present

## 2017-06-18 DIAGNOSIS — M79671 Pain in right foot: Secondary | ICD-10-CM | POA: Diagnosis not present

## 2017-06-18 DIAGNOSIS — M722 Plantar fascial fibromatosis: Secondary | ICD-10-CM | POA: Diagnosis not present

## 2017-06-22 ENCOUNTER — Encounter (HOSPITAL_COMMUNITY): Payer: Self-pay | Admitting: Emergency Medicine

## 2017-06-22 ENCOUNTER — Emergency Department (HOSPITAL_COMMUNITY)
Admission: EM | Admit: 2017-06-22 | Discharge: 2017-06-22 | Disposition: A | Discharge status: Home / Self Care | Payer: BLUE CROSS/BLUE SHIELD | Attending: Emergency Medicine | Admitting: Emergency Medicine

## 2017-06-22 ENCOUNTER — Emergency Department (HOSPITAL_COMMUNITY): Payer: BLUE CROSS/BLUE SHIELD

## 2017-06-22 DIAGNOSIS — Z79899 Other long term (current) drug therapy: Secondary | ICD-10-CM | POA: Insufficient documentation

## 2017-06-22 DIAGNOSIS — L7622 Postprocedural hemorrhage and hematoma of skin and subcutaneous tissue following other procedure: Secondary | ICD-10-CM | POA: Diagnosis not present

## 2017-06-22 DIAGNOSIS — T8131XA Disruption of external operation (surgical) wound, not elsewhere classified, initial encounter: Secondary | ICD-10-CM | POA: Diagnosis not present

## 2017-06-22 DIAGNOSIS — Z9851 Tubal ligation status: Secondary | ICD-10-CM

## 2017-06-22 DIAGNOSIS — R109 Unspecified abdominal pain: Secondary | ICD-10-CM | POA: Diagnosis not present

## 2017-06-22 DIAGNOSIS — G8918 Other acute postprocedural pain: Secondary | ICD-10-CM | POA: Diagnosis not present

## 2017-06-22 DIAGNOSIS — Y763 Surgical instruments, materials and obstetric and gynecological devices (including sutures) associated with adverse incidents: Secondary | ICD-10-CM | POA: Insufficient documentation

## 2017-06-22 DIAGNOSIS — Z87891 Personal history of nicotine dependence: Secondary | ICD-10-CM | POA: Diagnosis not present

## 2017-06-22 DIAGNOSIS — R740 Nonspecific elevation of levels of transaminase and lactic acid dehydrogenase [LDH]: Secondary | ICD-10-CM

## 2017-06-22 DIAGNOSIS — R7401 Elevation of levels of liver transaminase levels: Secondary | ICD-10-CM

## 2017-06-22 HISTORY — DX: Plantar fascial fibromatosis: M72.2

## 2017-06-22 LAB — URINALYSIS, ROUTINE W REFLEX MICROSCOPIC
Glucose, UA: NEGATIVE mg/dL
Hgb urine dipstick: NEGATIVE
KETONES UR: NEGATIVE mg/dL
Leukocytes, UA: NEGATIVE
NITRITE: NEGATIVE
PROTEIN: NEGATIVE mg/dL
Specific Gravity, Urine: 1.015 (ref 1.005–1.030)
pH: 6 (ref 5.0–8.0)

## 2017-06-22 LAB — COMPREHENSIVE METABOLIC PANEL
ALBUMIN: 3.6 g/dL (ref 3.5–5.0)
ALT: 175 U/L — AB (ref 14–54)
AST: 162 U/L — AB (ref 15–41)
Alkaline Phosphatase: 248 U/L — ABNORMAL HIGH (ref 38–126)
Anion gap: 8 (ref 5–15)
BILIRUBIN TOTAL: 1.3 mg/dL — AB (ref 0.3–1.2)
BUN: 9 mg/dL (ref 6–20)
CALCIUM: 9.1 mg/dL (ref 8.9–10.3)
CO2: 27 mmol/L (ref 22–32)
CREATININE: 0.74 mg/dL (ref 0.44–1.00)
Chloride: 106 mmol/L (ref 101–111)
GFR calc Af Amer: >60 mL/min (ref >60)
GLUCOSE: 91 mg/dL (ref 65–99)
Potassium: 3.7 mmol/L (ref 3.5–5.1)
Sodium: 141 mmol/L (ref 135–145)
TOTAL PROTEIN: 7.5 g/dL (ref 6.5–8.1)

## 2017-06-22 LAB — CBC
HEMATOCRIT: 36.2 % (ref 36.0–46.0)
Hemoglobin: 11.9 g/dL — ABNORMAL LOW (ref 12.0–15.0)
MCH: 27.5 pg (ref 26.0–34.0)
MCHC: 32.9 g/dL (ref 30.0–36.0)
MCV: 83.8 fL (ref 78.0–100.0)
PLATELETS: 319 10*3/uL (ref 150–400)
RBC: 4.32 MIL/uL (ref 3.87–5.11)
RDW: 14.8 % (ref 11.5–15.5)
WBC: 10 10*3/uL (ref 4.0–10.5)

## 2017-06-22 LAB — LIPASE, BLOOD: Lipase: 18 U/L (ref 11–51)

## 2017-06-22 LAB — I-STAT BETA HCG BLOOD, ED (MC, WL, AP ONLY): I-stat hCG, quantitative: <5 m[IU]/mL (ref <5)

## 2017-06-22 MED ORDER — IOPAMIDOL (ISOVUE-300) INJECTION 61%
INTRAVENOUS | Status: AC
  Administered 2017-06-22: 100 mL via INTRAVENOUS
  Filled 2017-06-22: qty 100

## 2017-06-22 MED ORDER — HYDROGEN PEROXIDE 3 % EX SOLN
CUTANEOUS | Status: AC
  Filled 2017-06-22: qty 473

## 2017-06-22 MED ORDER — OXYCODONE HCL 5 MG PO TABS
10.0000 mg | ORAL_TABLET | Freq: Once | ORAL | Status: AC
  Administered 2017-06-22: 10 mg via ORAL
  Filled 2017-06-22: qty 2

## 2017-06-22 NOTE — ED Provider Notes (Signed)
[facility] Provider Note   CSN: [id number] Arrival date & time: 06/22/17  1120     History   Chief Complaint Chief Complaint  Patient presents with  . surgical site bleeding    HPI [name] is a 36 y.o. female.  HPI   36 year old female presents with concern for continuing postoperative abdominal pain and bloody drainage from her umbilical incision after a tubal ligation that was performed on July 12 with Dr. Rana Snare of [facility].  Reports that she had increasing pain since surgery, cramping across lower abdomen.  She was seen on Friday by her OB/GYN who started her on antibiotics and provided her pain medications. Reports that with the pain medication she feels her pain is under better control, however today when she got up in the morning and noticed blood coming from the umbilical incision. Reports that was more than just a couple drops of blood that it was dripping down her abdomen. It is spontaneously stopped at this point. Denies fevers, chills, urinary symptoms, vaginal bleeding or discharge, change in BM.  Past Medical History:  Diagnosis Date  . AMA (advanced maternal age) primigravida 35+   . Anxiety   . Back pain, chronic   . Depression   . Headache(784.0)   . Hx of endometriosis   . Injury of coccyx   . Plantar fasciitis   . Ruptured lumbar disc    L4 L5  . Vaginal Pap smear, abnormal     Patient Active Problem List   Diagnosis Date Noted  . NSVD (normal spontaneous vaginal delivery) 08/16/2014  . Pregnancy 08/15/2014    Past Surgical History:  Procedure Laterality Date  . CHOLECYSTECTOMY    . DILATION AND CURETTAGE OF UTERUS    . DILATION AND CURETTAGE OF UTERUS    . DILATION AND EVACUATION    . LAPAROSCOPY    . Leep    . TUBAL LIGATION      OB History    Gravida Para Term Preterm AB Living   3 2 2   1 2    SAB TAB Ectopic Multiple Live Births   1     0 2       Home Medications    Prior to Admission  medications   Medication Sig Start Date End Date Taking? Authorizing Provider  amoxicillin-clavulanate (AUGMENTIN) 875-125 MG tablet Take 1 tablet by mouth 2 (two) times daily. for 10 days 06/20/17  Yes [provider]  calcium carbonate (TUMS - DOSED IN MG ELEMENTAL CALCIUM) 500 MG chewable tablet Chew 2 tablets by mouth daily as needed for indigestion or heartburn.   Yes [provider]  diphenhydrAMINE (BENADRYL) 12.5 MG/5ML liquid Take 25 mg by mouth daily as needed for allergies.   Yes [provider]  loratadine (CLARITIN) 10 MG tablet Take 10 mg by mouth daily.   Yes [provider]  oxyCODONE-acetaminophen (PERCOCET/ROXICET) 5-325 MG tablet Take 1 tablet by mouth every 4 (four) hours as needed (pain scale 4-7). 04/20/17  Yes Marcelle Overlie, MD  Pediatric Multiple Vit-C-FA (FLINSTONES GUMMIES OMEGA-3 DHA PO) Take 2 tablets by mouth daily.   Yes [provider]  ibuprofen (ADVIL,MOTRIN) 600 MG tablet Take 1 tablet (600 mg total) by mouth every 6 (six) hours. Patient not taking: Reported on 06/22/2017 04/20/17   Marcelle Overlie, MD    Family History Family History  Problem Relation Age of Onset  . Hypertension Father   . Cancer Sister  cervical  . Cancer Maternal Grandmother        bone  . Cancer Maternal Grandfather        brain and colon  . Diabetes Paternal Grandfather     Social History Social History  Substance Use Topics  . Smoking status: Former Smoker    Quit date: 03/03/2014  . Smokeless tobacco: Never Used  . Alcohol use No     Allergies   Celebrex [celecoxib] and Wellbutrin [bupropion]   Review of Systems Review of Systems  Constitutional: Negative for fever.  HENT: Negative for sore throat.   Eyes: Negative for visual disturbance.  Respiratory: Negative for cough and shortness of breath.   Cardiovascular: Negative for chest pain.  Gastrointestinal: Positive for abdominal pain. Negative for constipation,  diarrhea, nausea and vomiting.  Genitourinary: Negative for difficulty urinating, vaginal bleeding and vaginal discharge.  Musculoskeletal: Negative for back pain and neck pain.  Skin: Positive for wound. Negative for rash.  Neurological: Negative for syncope and headaches.     Physical Exam Updated Vital Signs BP 125/77 (BP Location: Right Arm)   Pulse 65   Temp 98 F (36.7 C) (Oral)   Resp 16   SpO2 98%   Physical Exam  Constitutional: She is oriented to person, place, and time. She appears well-developed and well-nourished. No distress.  HENT:  Head: Normocephalic and atraumatic.  Eyes: Conjunctivae and EOM are normal.  Neck: Normal range of motion.  Cardiovascular: Normal rate, regular rhythm, normal heart sounds and intact distal pulses.  Exam reveals no gallop and no friction rub.   No murmur heard. Pulmonary/Chest: Effort normal and breath sounds normal. No respiratory distress. She has no wheezes. She has no rales.  Abdominal: Soft. She exhibits no distension. There is tenderness. There is guarding (LLQ).  Musculoskeletal: She exhibits no edema or tenderness.  Neurological: She is alert and oriented to person, place, and time.  Skin: Skin is warm and dry. No rash noted. She is not diaphoretic. No erythema.  Nursing note and vitals reviewed.    ED Treatments / Results  Labs (all labs ordered are listed, but only abnormal results are displayed) Labs Reviewed  COMPREHENSIVE METABOLIC PANEL - Abnormal; Notable for the following:       Result Value   AST 162 (*)    ALT 175 (*)    Alkaline Phosphatase 248 (*)    Total Bilirubin 1.3 (*)    All other components within normal limits  CBC - Abnormal; Notable for the following:    Hemoglobin 11.9 (*)    All other components within normal limits  URINALYSIS, ROUTINE W REFLEX MICROSCOPIC - Abnormal; Notable for the following:    Color, Urine AMBER (*)    APPearance HAZY (*)    Bilirubin Urine SMALL (*)    All other  components within normal limits  LIPASE, BLOOD  I-STAT BETA HCG BLOOD, ED ([facility], [facility], [facility] ONLY)    EKG  EKG Interpretation None       Radiology Ct Abdomen Pelvis W Contrast  Result Date: 06/22/2017 CLINICAL DATA:  Left lower quadrant abdominal pain, status post tubal ligation 10 days ago EXAM: CT ABDOMEN AND PELVIS WITH CONTRAST TECHNIQUE: Multidetector CT imaging of the abdomen and pelvis was performed using the standard protocol following bolus administration of intravenous contrast. CONTRAST:  100 mL Isovue 300 IV COMPARISON:  None. FINDINGS: Lower chest: Lung bases are clear. Hepatobiliary: Liver is within normal limits. Status post cholecystectomy. No intrahepatic or extrahepatic ductal dilatation.  Pancreas: Within normal limits. Spleen: Within normal limits. Adrenals/Urinary Tract: Adrenal glands within normal limits. Kidneys are within normal limits.  No hydronephrosis. Bladder is within normal limits. Stomach/Bowel: Stomach is within normal limits. No evidence of bowel obstruction. Normal appendix (series 2/image 53). Vascular/Lymphatic: No evidence of abdominal aortic aneurysm. No suspicious abdominopelvic lymphadenopathy. Reproductive: Uterus is mildly heterogeneous. Status post bilateral tubal ligation. Bilateral ovaries are grossly unremarkable. Other: Small volume complex pelvic ascites/hemorrhage. No free air. Musculoskeletal: Visualized osseous structures are within normal limits. IMPRESSION: Status post bilateral tubal ligation. Small volume complex pelvic ascites/hemorrhage, likely postprocedural. No free air. Electronically Signed   By: Charline BillsSriyesh  Krishnan M.D.   On: 06/22/2017 15:10    Procedures Procedures (including critical care time)  Medications Ordered in ED Medications  iopamidol (ISOVUE-300) 61 % injection (100 mLs Intravenous Contrast Given 06/22/17 1431)  oxyCODONE (Oxy IR/ROXICODONE) immediate release tablet 10 mg (10 mg Oral Given 06/22/17 1629)     Initial  Impression / Assessment and Plan / ED Course  I have reviewed the triage vital signs and the nursing notes.  Pertinent labs & imaging results that were available during my care of the patient were reviewed by me and considered in my medical decision making (see chart for details).     36 year old female presents with concern for continuing postoperative abdominal pain and bloody drainage from her umbilical incision after a tubal ligation that was performed on July 12 with Dr. Rana SnareLowe of [facility].  CT abdomen shows small volume pelvic fluid, likely postprocedural. Hgb 11.9, no leukocytosis, pregnancy test negative, no UTI.  Mild transaminitis. No specific RUQ pain, has hx of prior cholecystectomy. Patient has been taking percocet but does not describe taking too much and denies tylenol from other sources and doubt acetaminophen overdose.  Dr. Adalberto IllGrewall OBGYN on call came to the bedside to evaluate patient, able to express hematoma, and provided pt rx for oxycodone without tylenol given transaminitis. Recommend outpatient follow up.   Final Clinical Impressions(s) / ED Diagnoses   Final diagnoses:  Hx of tubal ligation  Postoperative pain  Open abdominal incision with drainage, initial encounter, bleeding from incision  Transaminitis    New Prescriptions Discharge Medication List as of 06/22/2017  5:05 PM       Alvira MondaySchlossman, Catilyn Boggus, MD 06/23/17 [phone]

## 2017-06-22 NOTE — Progress Notes (Signed)
36 [facility] presents with brownish drainage from incision and abdominal pain.\  Patient denies fever or chills  BP 125/77 (BP Location: Right Arm)   Pulse 65   Temp 98 F (36.7 C) (Oral)   Resp 16   SpO2 98%  Results for orders placed or performed during the hospital encounter of 06/22/17 (from the past 24 hour(s))  Lipase, blood     Status: None   Collection Time: 06/22/17 12:59 PM  Result Value Ref Range   Lipase 18 11 - 51 U/L  Comprehensive metabolic panel     Status: Abnormal   Collection Time: 06/22/17 12:59 PM  Result Value Ref Range   Sodium 141 135 - 145 mmol/L   Potassium 3.7 3.5 - 5.1 mmol/L   Chloride 106 101 - 111 mmol/L   CO2 27 22 - 32 mmol/L   Glucose, Bld 91 65 - 99 mg/dL   BUN 9 6 - 20 mg/dL   Creatinine, Ser 1.610.74 0.44 - 1.00 mg/dL   Calcium 9.1 8.9 - 09.610.3 mg/dL   Total Protein 7.5 6.5 - 8.1 g/dL   Albumin 3.6 3.5 - 5.0 g/dL   AST 045162 (H) 15 - 41 U/L   ALT 175 (H) 14 - 54 U/L   Alkaline Phosphatase 248 (H) 38 - 126 U/L   Total Bilirubin 1.3 (H) 0.3 - 1.2 mg/dL   GFR calc non Af Amer >60 >60 mL/min   GFR calc Af Amer >60 >60 mL/min   Anion gap 8 5 - 15  CBC     Status: Abnormal   Collection Time: 06/22/17 12:59 PM  Result Value Ref Range   WBC 10.0 4.0 - 10.5 K/uL   RBC 4.32 3.87 - 5.11 MIL/uL   Hemoglobin 11.9 (L) 12.0 - 15.0 g/dL   HCT 40.936.2 81.136.0 - 91.446.0 %   MCV 83.8 78.0 - 100.0 fL   MCH 27.5 26.0 - 34.0 pg   MCHC 32.9 30.0 - 36.0 g/dL   RDW 78.214.8 95.611.5 - 21.315.5 %   Platelets 319 150 - 400 K/uL  I-Stat Beta hCG blood, ED ([facility], [facility], [facility] only)     Status: None   Collection Time: 06/22/17  1:12 PM  Result Value Ref Range   I-stat hCG, quantitative <5.0 <5 mIU/mL   Comment 3          Urinalysis, Routine w reflex microscopic     Status: Abnormal   Collection Time: 06/22/17  1:53 PM  Result Value Ref Range   Color, Urine AMBER (A) YELLOW   APPearance HAZY (A) CLEAR   Specific Gravity, Urine 1.015 1.005 - 1.030   pH 6.0 5.0 - 8.0   Glucose, UA NEGATIVE NEGATIVE mg/dL   Hgb urine dipstick NEGATIVE NEGATIVE   Bilirubin Urine SMALL (A) NEGATIVE   Ketones, ur NEGATIVE NEGATIVE mg/dL   Protein, ur NEGATIVE NEGATIVE mg/dL   Nitrite NEGATIVE NEGATIVE   Leukocytes, UA NEGATIVE NEGATIVE   Abdomen  Incision is inspected  No erythema With deep palpation I can get a minimal to moderate amount of watery brown drainage consistent with draining old incisional hematoma Most is expressed and incision is clean with peroxide  IMPRESSION: POD # 11  Incisional hematoma Elevated LFTs  PLAN: Ok to discharge home Follow up with Dr. Rana SnareLowe tomorrow Rx oxycodone no tylenol given to patient Needs repeat cmet tomorrow

## 2017-06-22 NOTE — ED Notes (Signed)
Physician at bedside, procedure in progress.

## 2017-06-22 NOTE — ED Triage Notes (Signed)
Pt had a tubal ligation 10 days ago. Has had post-op abd pain with this. Saw surgeon for this on Friday and was given prescription for an abx and pain medication. Today her belly button, began to bleed. Pain radiates to back with pressure.

## 2017-06-23 DIAGNOSIS — Z113 Encounter for screening for infections with a predominantly sexual mode of transmission: Secondary | ICD-10-CM | POA: Diagnosis not present

## 2017-06-23 DIAGNOSIS — H43811 Vitreous degeneration, right eye: Secondary | ICD-10-CM | POA: Diagnosis not present

## 2017-06-23 DIAGNOSIS — Z4889 Encounter for other specified surgical aftercare: Secondary | ICD-10-CM | POA: Diagnosis not present

## 2017-06-23 DIAGNOSIS — R945 Abnormal results of liver function studies: Secondary | ICD-10-CM | POA: Diagnosis not present

## 2017-07-02 DIAGNOSIS — L7632 Postprocedural hematoma of skin and subcutaneous tissue following other procedure: Secondary | ICD-10-CM | POA: Diagnosis not present

## 2017-07-02 DIAGNOSIS — K759 Inflammatory liver disease, unspecified: Secondary | ICD-10-CM | POA: Diagnosis not present

## 2017-07-09 DIAGNOSIS — R945 Abnormal results of liver function studies: Secondary | ICD-10-CM | POA: Diagnosis not present

## 2017-07-15 DIAGNOSIS — M79671 Pain in right foot: Secondary | ICD-10-CM | POA: Diagnosis not present

## 2017-07-15 DIAGNOSIS — M722 Plantar fascial fibromatosis: Secondary | ICD-10-CM | POA: Diagnosis not present

## 2017-07-31 DIAGNOSIS — N946 Dysmenorrhea, unspecified: Secondary | ICD-10-CM | POA: Diagnosis not present

## 2017-07-31 DIAGNOSIS — N920 Excessive and frequent menstruation with regular cycle: Secondary | ICD-10-CM | POA: Diagnosis not present

## 2017-07-31 DIAGNOSIS — Z1329 Encounter for screening for other suspected endocrine disorder: Secondary | ICD-10-CM | POA: Diagnosis not present

## 2017-07-31 DIAGNOSIS — Z13228 Encounter for screening for other metabolic disorders: Secondary | ICD-10-CM | POA: Diagnosis not present

## 2017-08-05 DIAGNOSIS — M722 Plantar fascial fibromatosis: Secondary | ICD-10-CM | POA: Diagnosis not present

## 2017-08-05 DIAGNOSIS — M79671 Pain in right foot: Secondary | ICD-10-CM | POA: Diagnosis not present

## 2017-08-20 DIAGNOSIS — N946 Dysmenorrhea, unspecified: Secondary | ICD-10-CM | POA: Diagnosis not present

## 2017-08-20 DIAGNOSIS — N924 Excessive bleeding in the premenopausal period: Secondary | ICD-10-CM | POA: Diagnosis not present

## 2017-08-20 DIAGNOSIS — R102 Pelvic and perineal pain: Secondary | ICD-10-CM | POA: Diagnosis not present

## 2017-08-26 DIAGNOSIS — M722 Plantar fascial fibromatosis: Secondary | ICD-10-CM | POA: Diagnosis not present

## 2017-08-26 DIAGNOSIS — M79671 Pain in right foot: Secondary | ICD-10-CM | POA: Diagnosis not present

## 2017-08-27 DIAGNOSIS — R590 Localized enlarged lymph nodes: Secondary | ICD-10-CM | POA: Diagnosis not present

## 2017-09-17 DIAGNOSIS — N920 Excessive and frequent menstruation with regular cycle: Secondary | ICD-10-CM | POA: Diagnosis not present

## 2017-09-17 DIAGNOSIS — R102 Pelvic and perineal pain: Secondary | ICD-10-CM | POA: Diagnosis not present

## 2017-09-17 DIAGNOSIS — N939 Abnormal uterine and vaginal bleeding, unspecified: Secondary | ICD-10-CM | POA: Diagnosis not present

## 2017-09-22 ENCOUNTER — Other Ambulatory Visit: Payer: Self-pay | Admitting: Obstetrics and Gynecology

## 2017-09-22 DIAGNOSIS — N920 Excessive and frequent menstruation with regular cycle: Secondary | ICD-10-CM | POA: Diagnosis not present

## 2017-09-22 DIAGNOSIS — N946 Dysmenorrhea, unspecified: Secondary | ICD-10-CM | POA: Diagnosis not present

## 2017-09-22 DIAGNOSIS — N944 Primary dysmenorrhea: Secondary | ICD-10-CM | POA: Diagnosis not present

## 2017-10-14 DIAGNOSIS — M5106 Intervertebral disc disorders with myelopathy, lumbar region: Secondary | ICD-10-CM | POA: Diagnosis not present

## 2017-10-15 DIAGNOSIS — M79671 Pain in right foot: Secondary | ICD-10-CM | POA: Diagnosis not present

## 2017-10-15 DIAGNOSIS — M722 Plantar fascial fibromatosis: Secondary | ICD-10-CM | POA: Diagnosis not present

## 2017-12-24 DIAGNOSIS — M654 Radial styloid tenosynovitis [de Quervain]: Secondary | ICD-10-CM | POA: Diagnosis not present

## 2018-04-06 DIAGNOSIS — J039 Acute tonsillitis, unspecified: Secondary | ICD-10-CM | POA: Diagnosis not present

## 2018-04-10 DIAGNOSIS — J039 Acute tonsillitis, unspecified: Secondary | ICD-10-CM | POA: Diagnosis not present

## 2018-04-10 DIAGNOSIS — H6980 Other specified disorders of Eustachian tube, unspecified ear: Secondary | ICD-10-CM | POA: Diagnosis not present

## 2018-04-24 DIAGNOSIS — H93292 Other abnormal auditory perceptions, left ear: Secondary | ICD-10-CM | POA: Diagnosis not present

## 2018-04-24 DIAGNOSIS — H6982 Other specified disorders of Eustachian tube, left ear: Secondary | ICD-10-CM | POA: Diagnosis not present

## 2018-04-24 DIAGNOSIS — J301 Allergic rhinitis due to pollen: Secondary | ICD-10-CM | POA: Diagnosis not present

## 2018-04-29 DIAGNOSIS — M5106 Intervertebral disc disorders with myelopathy, lumbar region: Secondary | ICD-10-CM | POA: Diagnosis not present

## 2018-05-15 NOTE — Telephone Encounter (Signed)
Preadmission screen  

## 2018-07-14 DIAGNOSIS — R1031 Right lower quadrant pain: Secondary | ICD-10-CM | POA: Diagnosis not present

## 2018-07-14 DIAGNOSIS — Z3202 Encounter for pregnancy test, result negative: Secondary | ICD-10-CM | POA: Diagnosis not present

## 2018-08-10 ENCOUNTER — Ambulatory Visit: Admit: 2018-08-10 | Payer: BLUE CROSS/BLUE SHIELD | Admitting: Obstetrics and Gynecology

## 2018-08-10 SURGERY — HYSTERECTOMY, VAGINAL, LAPAROSCOPY-ASSISTED, WITH SALPINGECTOMY
Anesthesia: General | Laterality: Bilateral

## 2018-08-11 DIAGNOSIS — L723 Sebaceous cyst: Secondary | ICD-10-CM | POA: Diagnosis not present

## 2018-08-19 ENCOUNTER — Other Ambulatory Visit: Payer: Self-pay | Admitting: Orthopaedic Surgery

## 2018-08-19 ENCOUNTER — Other Ambulatory Visit: Payer: Self-pay | Admitting: Orthopedic Surgery

## 2018-08-19 DIAGNOSIS — M545 Low back pain, unspecified: Secondary | ICD-10-CM

## 2018-08-19 DIAGNOSIS — M5106 Intervertebral disc disorders with myelopathy, lumbar region: Secondary | ICD-10-CM | POA: Diagnosis not present

## 2018-08-23 ENCOUNTER — Ambulatory Visit
Admission: RE | Admit: 2018-08-23 | Discharge: 2018-08-23 | Disposition: A | Discharge status: Home / Self Care | Payer: BLUE CROSS/BLUE SHIELD | Source: Ambulatory Visit | Attending: Orthopaedic Surgery | Admitting: Orthopaedic Surgery

## 2018-08-23 DIAGNOSIS — M545 Low back pain, unspecified: Secondary | ICD-10-CM

## 2018-09-01 DIAGNOSIS — M5106 Intervertebral disc disorders with myelopathy, lumbar region: Secondary | ICD-10-CM | POA: Diagnosis not present

## 2018-12-14 DIAGNOSIS — G894 Chronic pain syndrome: Secondary | ICD-10-CM | POA: Diagnosis not present

## 2018-12-14 DIAGNOSIS — Z79891 Long term (current) use of opiate analgesic: Secondary | ICD-10-CM | POA: Diagnosis not present

## 2018-12-14 DIAGNOSIS — M47816 Spondylosis without myelopathy or radiculopathy, lumbar region: Secondary | ICD-10-CM | POA: Diagnosis not present

## 2018-12-14 DIAGNOSIS — M5416 Radiculopathy, lumbar region: Secondary | ICD-10-CM | POA: Diagnosis not present

## 2018-12-14 DIAGNOSIS — G4700 Insomnia, unspecified: Secondary | ICD-10-CM | POA: Diagnosis not present

## 2019-01-12 DIAGNOSIS — M47816 Spondylosis without myelopathy or radiculopathy, lumbar region: Secondary | ICD-10-CM | POA: Diagnosis not present

## 2019-01-12 DIAGNOSIS — G894 Chronic pain syndrome: Secondary | ICD-10-CM | POA: Diagnosis not present

## 2019-01-12 DIAGNOSIS — G4700 Insomnia, unspecified: Secondary | ICD-10-CM | POA: Diagnosis not present

## 2019-01-12 DIAGNOSIS — M5416 Radiculopathy, lumbar region: Secondary | ICD-10-CM | POA: Diagnosis not present

## 2019-02-09 DIAGNOSIS — G4700 Insomnia, unspecified: Secondary | ICD-10-CM | POA: Diagnosis not present

## 2019-02-09 DIAGNOSIS — M47816 Spondylosis without myelopathy or radiculopathy, lumbar region: Secondary | ICD-10-CM | POA: Diagnosis not present

## 2019-02-09 DIAGNOSIS — M5416 Radiculopathy, lumbar region: Secondary | ICD-10-CM | POA: Diagnosis not present

## 2019-02-09 DIAGNOSIS — G894 Chronic pain syndrome: Secondary | ICD-10-CM | POA: Diagnosis not present

## 2019-03-12 DIAGNOSIS — F410 Panic disorder [episodic paroxysmal anxiety] without agoraphobia: Secondary | ICD-10-CM | POA: Diagnosis not present

## 2019-03-12 DIAGNOSIS — R079 Chest pain, unspecified: Secondary | ICD-10-CM | POA: Diagnosis not present

## 2019-03-16 DIAGNOSIS — R079 Chest pain, unspecified: Secondary | ICD-10-CM | POA: Diagnosis not present

## 2019-03-16 DIAGNOSIS — F418 Other specified anxiety disorders: Secondary | ICD-10-CM | POA: Diagnosis not present

## 2019-03-16 DIAGNOSIS — M545 Low back pain: Secondary | ICD-10-CM | POA: Diagnosis not present

## 2019-03-16 DIAGNOSIS — Z1331 Encounter for screening for depression: Secondary | ICD-10-CM | POA: Diagnosis not present

## 2019-03-19 DIAGNOSIS — M5416 Radiculopathy, lumbar region: Secondary | ICD-10-CM | POA: Diagnosis not present

## 2019-03-19 DIAGNOSIS — M47816 Spondylosis without myelopathy or radiculopathy, lumbar region: Secondary | ICD-10-CM | POA: Diagnosis not present

## 2019-03-19 DIAGNOSIS — G894 Chronic pain syndrome: Secondary | ICD-10-CM | POA: Diagnosis not present

## 2019-03-19 DIAGNOSIS — G4700 Insomnia, unspecified: Secondary | ICD-10-CM | POA: Diagnosis not present

## 2019-03-30 DIAGNOSIS — M47816 Spondylosis without myelopathy or radiculopathy, lumbar region: Secondary | ICD-10-CM | POA: Diagnosis not present

## 2019-04-15 DIAGNOSIS — M5416 Radiculopathy, lumbar region: Secondary | ICD-10-CM | POA: Diagnosis not present

## 2019-04-15 DIAGNOSIS — M47816 Spondylosis without myelopathy or radiculopathy, lumbar region: Secondary | ICD-10-CM | POA: Diagnosis not present

## 2019-04-15 DIAGNOSIS — G43109 Migraine with aura, not intractable, without status migrainosus: Secondary | ICD-10-CM | POA: Diagnosis not present

## 2019-04-15 DIAGNOSIS — F419 Anxiety disorder, unspecified: Secondary | ICD-10-CM | POA: Diagnosis not present

## 2019-04-15 DIAGNOSIS — G894 Chronic pain syndrome: Secondary | ICD-10-CM | POA: Diagnosis not present

## 2019-04-15 DIAGNOSIS — G4700 Insomnia, unspecified: Secondary | ICD-10-CM | POA: Diagnosis not present

## 2019-05-24 DIAGNOSIS — G4700 Insomnia, unspecified: Secondary | ICD-10-CM | POA: Diagnosis not present

## 2019-05-24 DIAGNOSIS — M47816 Spondylosis without myelopathy or radiculopathy, lumbar region: Secondary | ICD-10-CM | POA: Diagnosis not present

## 2019-05-24 DIAGNOSIS — G894 Chronic pain syndrome: Secondary | ICD-10-CM | POA: Diagnosis not present

## 2019-05-24 DIAGNOSIS — M5416 Radiculopathy, lumbar region: Secondary | ICD-10-CM | POA: Diagnosis not present

## 2019-05-27 DIAGNOSIS — M47816 Spondylosis without myelopathy or radiculopathy, lumbar region: Secondary | ICD-10-CM | POA: Diagnosis not present

## 2019-06-23 DIAGNOSIS — G4700 Insomnia, unspecified: Secondary | ICD-10-CM | POA: Diagnosis not present

## 2019-06-23 DIAGNOSIS — M47816 Spondylosis without myelopathy or radiculopathy, lumbar region: Secondary | ICD-10-CM | POA: Diagnosis not present

## 2019-06-23 DIAGNOSIS — G894 Chronic pain syndrome: Secondary | ICD-10-CM | POA: Diagnosis not present

## 2019-06-23 DIAGNOSIS — M5416 Radiculopathy, lumbar region: Secondary | ICD-10-CM | POA: Diagnosis not present

## 2019-07-06 DIAGNOSIS — M5416 Radiculopathy, lumbar region: Secondary | ICD-10-CM | POA: Diagnosis not present

## 2019-07-06 DIAGNOSIS — G4700 Insomnia, unspecified: Secondary | ICD-10-CM | POA: Diagnosis not present

## 2019-07-06 DIAGNOSIS — G894 Chronic pain syndrome: Secondary | ICD-10-CM | POA: Diagnosis not present

## 2019-07-06 DIAGNOSIS — M47816 Spondylosis without myelopathy or radiculopathy, lumbar region: Secondary | ICD-10-CM | POA: Diagnosis not present

## 2019-07-16 DIAGNOSIS — G43109 Migraine with aura, not intractable, without status migrainosus: Secondary | ICD-10-CM | POA: Diagnosis not present

## 2019-07-16 DIAGNOSIS — M7062 Trochanteric bursitis, left hip: Secondary | ICD-10-CM | POA: Diagnosis not present

## 2019-07-16 DIAGNOSIS — Z6841 Body Mass Index (BMI) 40.0 and over, adult: Secondary | ICD-10-CM | POA: Diagnosis not present

## 2019-07-16 DIAGNOSIS — F419 Anxiety disorder, unspecified: Secondary | ICD-10-CM | POA: Diagnosis not present

## 2019-07-19 DIAGNOSIS — Z6841 Body Mass Index (BMI) 40.0 and over, adult: Secondary | ICD-10-CM | POA: Diagnosis not present

## 2019-07-19 DIAGNOSIS — L255 Unspecified contact dermatitis due to plants, except food: Secondary | ICD-10-CM | POA: Diagnosis not present

## 2019-07-21 DIAGNOSIS — G894 Chronic pain syndrome: Secondary | ICD-10-CM | POA: Diagnosis not present

## 2019-07-21 DIAGNOSIS — M5416 Radiculopathy, lumbar region: Secondary | ICD-10-CM | POA: Diagnosis not present

## 2019-07-21 DIAGNOSIS — G4700 Insomnia, unspecified: Secondary | ICD-10-CM | POA: Diagnosis not present

## 2019-07-21 DIAGNOSIS — M47816 Spondylosis without myelopathy or radiculopathy, lumbar region: Secondary | ICD-10-CM | POA: Diagnosis not present

## 2019-07-30 DIAGNOSIS — Z6841 Body Mass Index (BMI) 40.0 and over, adult: Secondary | ICD-10-CM | POA: Diagnosis not present

## 2019-07-30 DIAGNOSIS — R21 Rash and other nonspecific skin eruption: Secondary | ICD-10-CM | POA: Diagnosis not present

## 2019-08-12 ENCOUNTER — Other Ambulatory Visit: Payer: Self-pay | Admitting: Physical Medicine and Rehabilitation

## 2019-08-12 DIAGNOSIS — M545 Low back pain, unspecified: Secondary | ICD-10-CM

## 2019-08-18 DIAGNOSIS — G4700 Insomnia, unspecified: Secondary | ICD-10-CM | POA: Diagnosis not present

## 2019-08-18 DIAGNOSIS — M5416 Radiculopathy, lumbar region: Secondary | ICD-10-CM | POA: Diagnosis not present

## 2019-08-18 DIAGNOSIS — M47816 Spondylosis without myelopathy or radiculopathy, lumbar region: Secondary | ICD-10-CM | POA: Diagnosis not present

## 2019-08-18 DIAGNOSIS — G894 Chronic pain syndrome: Secondary | ICD-10-CM | POA: Diagnosis not present

## 2019-08-29 ENCOUNTER — Other Ambulatory Visit: Payer: Self-pay

## 2019-08-29 ENCOUNTER — Ambulatory Visit
Admission: RE | Admit: 2019-08-29 | Discharge: 2019-08-29 | Disposition: A | Discharge status: Home / Self Care | Payer: BLUE CROSS/BLUE SHIELD | Source: Ambulatory Visit | Attending: Physical Medicine and Rehabilitation | Admitting: Physical Medicine and Rehabilitation

## 2019-08-29 DIAGNOSIS — M48061 Spinal stenosis, lumbar region without neurogenic claudication: Secondary | ICD-10-CM | POA: Diagnosis not present

## 2019-08-29 DIAGNOSIS — M545 Low back pain, unspecified: Secondary | ICD-10-CM

## 2019-09-09 DIAGNOSIS — N803 Endometriosis of pelvic peritoneum: Secondary | ICD-10-CM | POA: Diagnosis not present

## 2019-09-09 DIAGNOSIS — N939 Abnormal uterine and vaginal bleeding, unspecified: Secondary | ICD-10-CM | POA: Diagnosis not present

## 2019-09-15 DIAGNOSIS — G894 Chronic pain syndrome: Secondary | ICD-10-CM | POA: Diagnosis not present

## 2019-09-15 DIAGNOSIS — G4700 Insomnia, unspecified: Secondary | ICD-10-CM | POA: Diagnosis not present

## 2019-09-15 DIAGNOSIS — M47816 Spondylosis without myelopathy or radiculopathy, lumbar region: Secondary | ICD-10-CM | POA: Diagnosis not present

## 2019-09-15 DIAGNOSIS — M5416 Radiculopathy, lumbar region: Secondary | ICD-10-CM | POA: Diagnosis not present

## 2019-10-04 DIAGNOSIS — M5126 Other intervertebral disc displacement, lumbar region: Secondary | ICD-10-CM | POA: Diagnosis not present

## 2019-10-04 DIAGNOSIS — M5416 Radiculopathy, lumbar region: Secondary | ICD-10-CM | POA: Diagnosis not present

## 2019-10-13 DIAGNOSIS — M5416 Radiculopathy, lumbar region: Secondary | ICD-10-CM | POA: Diagnosis not present

## 2019-10-13 DIAGNOSIS — G894 Chronic pain syndrome: Secondary | ICD-10-CM | POA: Diagnosis not present

## 2019-10-13 DIAGNOSIS — M5126 Other intervertebral disc displacement, lumbar region: Secondary | ICD-10-CM | POA: Diagnosis not present

## 2019-10-13 DIAGNOSIS — M47816 Spondylosis without myelopathy or radiculopathy, lumbar region: Secondary | ICD-10-CM | POA: Diagnosis not present

## 2019-11-10 DIAGNOSIS — M5416 Radiculopathy, lumbar region: Secondary | ICD-10-CM | POA: Diagnosis not present

## 2019-11-10 DIAGNOSIS — M5126 Other intervertebral disc displacement, lumbar region: Secondary | ICD-10-CM | POA: Diagnosis not present

## 2019-11-10 DIAGNOSIS — M47816 Spondylosis without myelopathy or radiculopathy, lumbar region: Secondary | ICD-10-CM | POA: Diagnosis not present

## 2019-11-10 DIAGNOSIS — G894 Chronic pain syndrome: Secondary | ICD-10-CM | POA: Diagnosis not present

## 2019-11-19 DIAGNOSIS — Z20828 Contact with and (suspected) exposure to other viral communicable diseases: Secondary | ICD-10-CM | POA: Diagnosis not present

## 2019-11-19 DIAGNOSIS — E669 Obesity, unspecified: Secondary | ICD-10-CM | POA: Diagnosis not present

## 2019-11-28 DIAGNOSIS — Z20828 Contact with and (suspected) exposure to other viral communicable diseases: Secondary | ICD-10-CM | POA: Diagnosis not present

## 2019-11-30 DIAGNOSIS — J209 Acute bronchitis, unspecified: Secondary | ICD-10-CM | POA: Diagnosis not present

## 2019-12-06 DIAGNOSIS — R05 Cough: Secondary | ICD-10-CM | POA: Diagnosis not present

## 2019-12-08 DIAGNOSIS — F4321 Adjustment disorder with depressed mood: Secondary | ICD-10-CM | POA: Diagnosis not present

## 2019-12-08 DIAGNOSIS — R05 Cough: Secondary | ICD-10-CM | POA: Diagnosis not present

## 2019-12-08 DIAGNOSIS — M47816 Spondylosis without myelopathy or radiculopathy, lumbar region: Secondary | ICD-10-CM | POA: Diagnosis not present

## 2019-12-08 DIAGNOSIS — G4700 Insomnia, unspecified: Secondary | ICD-10-CM | POA: Diagnosis not present

## 2019-12-08 DIAGNOSIS — G894 Chronic pain syndrome: Secondary | ICD-10-CM | POA: Diagnosis not present

## 2019-12-20 DIAGNOSIS — Z20828 Contact with and (suspected) exposure to other viral communicable diseases: Secondary | ICD-10-CM | POA: Diagnosis not present

## 2019-12-20 DIAGNOSIS — Z20822 Contact with and (suspected) exposure to covid-19: Secondary | ICD-10-CM | POA: Diagnosis not present

## 2019-12-20 DIAGNOSIS — J029 Acute pharyngitis, unspecified: Secondary | ICD-10-CM | POA: Diagnosis not present

## 2019-12-23 DIAGNOSIS — J029 Acute pharyngitis, unspecified: Secondary | ICD-10-CM | POA: Diagnosis not present

## 2019-12-23 DIAGNOSIS — B9689 Other specified bacterial agents as the cause of diseases classified elsewhere: Secondary | ICD-10-CM | POA: Diagnosis not present

## 2019-12-23 DIAGNOSIS — H833X3 Noise effects on inner ear, bilateral: Secondary | ICD-10-CM | POA: Diagnosis not present

## 2019-12-23 DIAGNOSIS — J038 Acute tonsillitis due to other specified organisms: Secondary | ICD-10-CM | POA: Diagnosis not present

## 2019-12-23 DIAGNOSIS — H93293 Other abnormal auditory perceptions, bilateral: Secondary | ICD-10-CM | POA: Diagnosis not present

## 2020-01-06 DIAGNOSIS — G4700 Insomnia, unspecified: Secondary | ICD-10-CM | POA: Diagnosis not present

## 2020-01-06 DIAGNOSIS — F4321 Adjustment disorder with depressed mood: Secondary | ICD-10-CM | POA: Diagnosis not present

## 2020-01-06 DIAGNOSIS — M47816 Spondylosis without myelopathy or radiculopathy, lumbar region: Secondary | ICD-10-CM | POA: Diagnosis not present

## 2020-01-06 DIAGNOSIS — G894 Chronic pain syndrome: Secondary | ICD-10-CM | POA: Diagnosis not present

## 2020-01-19 DIAGNOSIS — M722 Plantar fascial fibromatosis: Secondary | ICD-10-CM | POA: Diagnosis not present

## 2020-01-19 DIAGNOSIS — M79672 Pain in left foot: Secondary | ICD-10-CM | POA: Diagnosis not present

## 2020-01-19 DIAGNOSIS — M79671 Pain in right foot: Secondary | ICD-10-CM | POA: Diagnosis not present

## 2020-02-03 DIAGNOSIS — M47816 Spondylosis without myelopathy or radiculopathy, lumbar region: Secondary | ICD-10-CM | POA: Diagnosis not present

## 2020-02-03 DIAGNOSIS — F4321 Adjustment disorder with depressed mood: Secondary | ICD-10-CM | POA: Diagnosis not present

## 2020-02-03 DIAGNOSIS — G894 Chronic pain syndrome: Secondary | ICD-10-CM | POA: Diagnosis not present

## 2020-02-03 DIAGNOSIS — G4700 Insomnia, unspecified: Secondary | ICD-10-CM | POA: Diagnosis not present

## 2020-02-28 DIAGNOSIS — L0232 Furuncle of buttock: Secondary | ICD-10-CM | POA: Diagnosis not present

## 2020-03-01 DIAGNOSIS — G4700 Insomnia, unspecified: Secondary | ICD-10-CM | POA: Diagnosis not present

## 2020-03-01 DIAGNOSIS — F4321 Adjustment disorder with depressed mood: Secondary | ICD-10-CM | POA: Diagnosis not present

## 2020-03-01 DIAGNOSIS — M47816 Spondylosis without myelopathy or radiculopathy, lumbar region: Secondary | ICD-10-CM | POA: Diagnosis not present

## 2020-03-01 DIAGNOSIS — G894 Chronic pain syndrome: Secondary | ICD-10-CM | POA: Diagnosis not present

## 2020-03-08 DIAGNOSIS — M722 Plantar fascial fibromatosis: Secondary | ICD-10-CM | POA: Diagnosis not present

## 2020-03-10 DIAGNOSIS — Z6841 Body Mass Index (BMI) 40.0 and over, adult: Secondary | ICD-10-CM | POA: Diagnosis not present

## 2020-03-10 DIAGNOSIS — M7712 Lateral epicondylitis, left elbow: Secondary | ICD-10-CM | POA: Diagnosis not present

## 2020-03-30 DIAGNOSIS — Z03818 Encounter for observation for suspected exposure to other biological agents ruled out: Secondary | ICD-10-CM | POA: Diagnosis not present

## 2020-03-30 DIAGNOSIS — Z20822 Contact with and (suspected) exposure to covid-19: Secondary | ICD-10-CM | POA: Diagnosis not present

## 2020-05-22 DIAGNOSIS — G4700 Insomnia, unspecified: Secondary | ICD-10-CM | POA: Diagnosis not present

## 2020-05-22 DIAGNOSIS — Z79891 Long term (current) use of opiate analgesic: Secondary | ICD-10-CM | POA: Diagnosis not present

## 2020-05-22 DIAGNOSIS — M47816 Spondylosis without myelopathy or radiculopathy, lumbar region: Secondary | ICD-10-CM | POA: Diagnosis not present

## 2020-05-22 DIAGNOSIS — G894 Chronic pain syndrome: Secondary | ICD-10-CM | POA: Diagnosis not present

## 2020-06-14 DIAGNOSIS — M47816 Spondylosis without myelopathy or radiculopathy, lumbar region: Secondary | ICD-10-CM | POA: Diagnosis not present

## 2020-06-15 DIAGNOSIS — R197 Diarrhea, unspecified: Secondary | ICD-10-CM | POA: Diagnosis not present

## 2020-06-15 DIAGNOSIS — R6883 Chills (without fever): Secondary | ICD-10-CM | POA: Diagnosis not present

## 2020-06-15 DIAGNOSIS — R42 Dizziness and giddiness: Secondary | ICD-10-CM | POA: Diagnosis not present

## 2020-06-15 DIAGNOSIS — Z6841 Body Mass Index (BMI) 40.0 and over, adult: Secondary | ICD-10-CM | POA: Diagnosis not present

## 2020-06-28 DIAGNOSIS — M47816 Spondylosis without myelopathy or radiculopathy, lumbar region: Secondary | ICD-10-CM | POA: Diagnosis not present

## 2020-08-08 IMAGING — MR MR LUMBAR SPINE W/O CM
4 of 5 series · 18 of 48 positions shown · non-contrast
Comparison: CT Abdomen and Pelvis 06/22/2017. Lumbar MRI
03/11/2016.

CLINICAL DATA: 37-year-old female with acute on chronic lumbar back
pain, bilateral buttock pain and leg pain. Prior spinal injection
1-2 years ago with some relief.

EXAM:
MRI LUMBAR SPINE WITHOUT CONTRAST
TECHNIQUE: Multiplanar, multisequence MR imaging of the lumbar spine was
performed. No intravenous contrast was administered.

[Series 6: T2 · sagittal · 4.0mm · 0.73mm/px · 6 of 13 slices shown (1 of 2)]
[im 1/13]
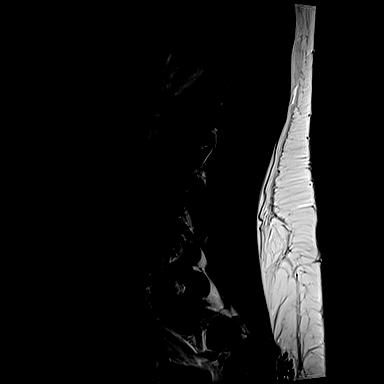
[im 3/13]
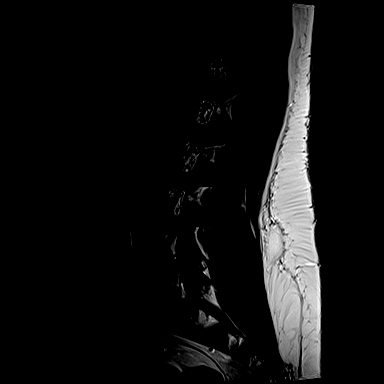
[im 5/13]
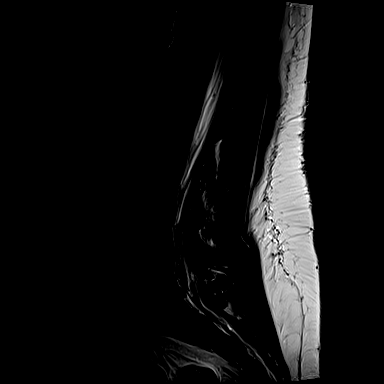
[im 8/13]
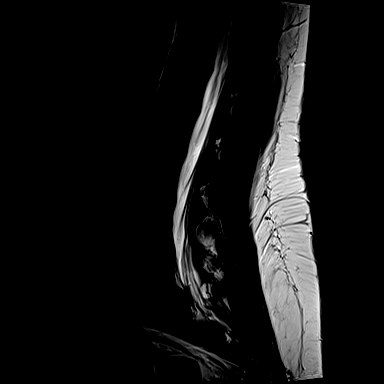
[im 10/13]
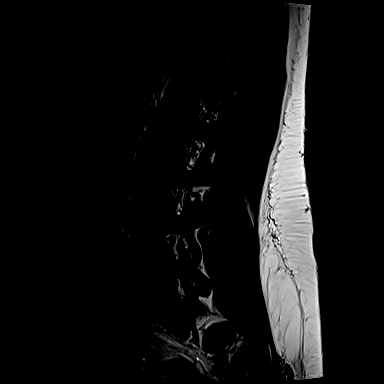
[im 13/13]
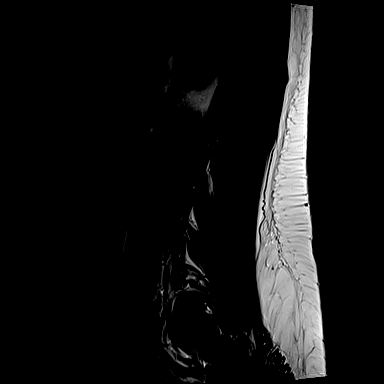

[Series 7: T1 · sagittal · 4.0mm · 0.73mm/px · 3 of 13 slices shown (1 of 2)]
[im 1/13]
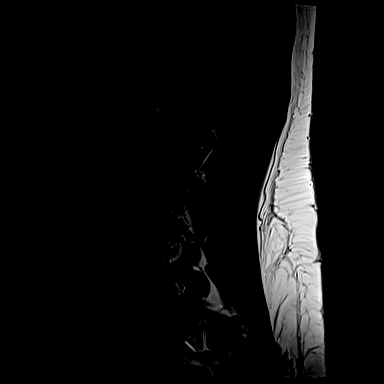
[im 7/13]
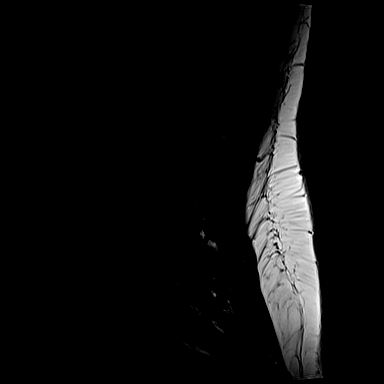
[im 13/13]
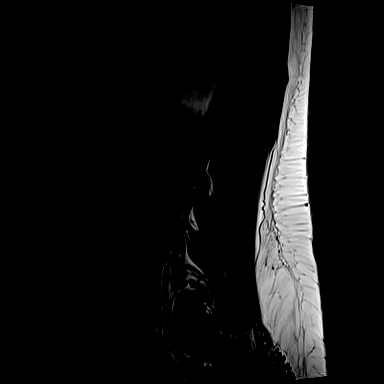

[Series 11: T1 · axial · 4.0mm · 0.28mm/px · z∈[-57,+111]mm · 3 of 39 slices shown (2 of 2)]
[im 6/39]
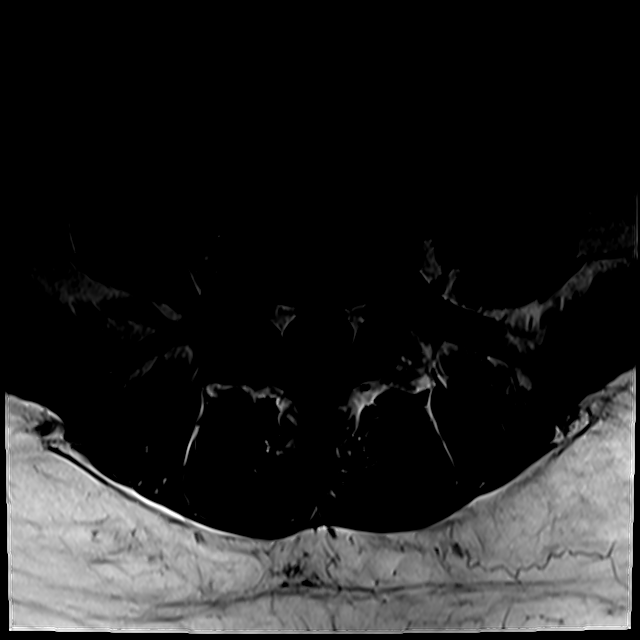
[im 21/39]
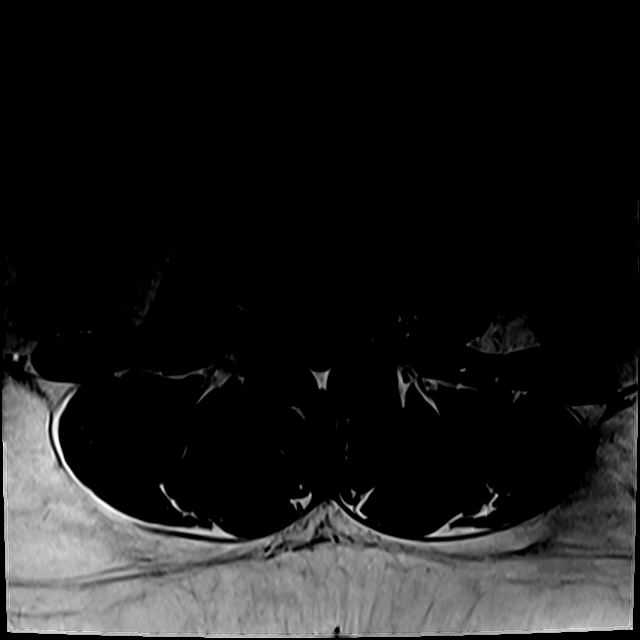
[im 33/39]
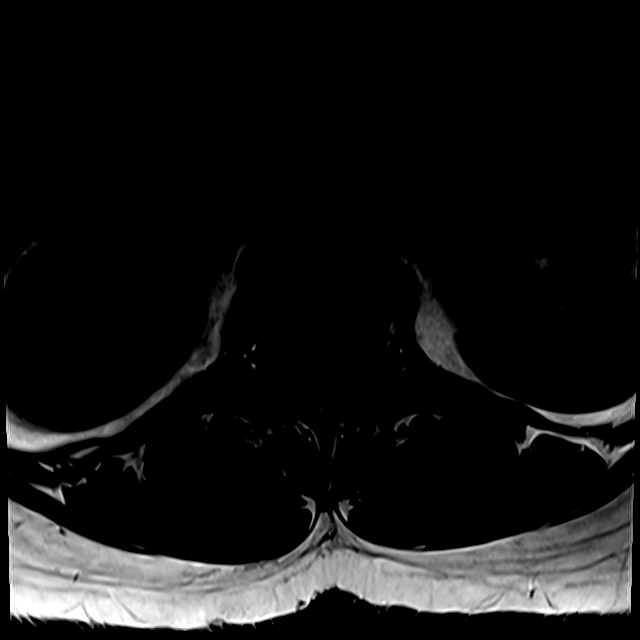

[Series 14: T2 · axial · 4.0mm · 0.28mm/px · z∈[-72,+111]mm · 6 of 39 slices shown (2 of 2)]
[im 3/39]
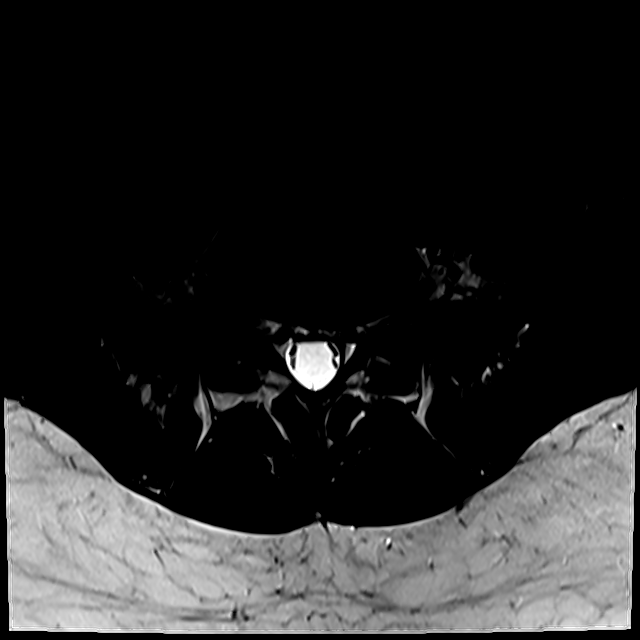
[im 6/39]
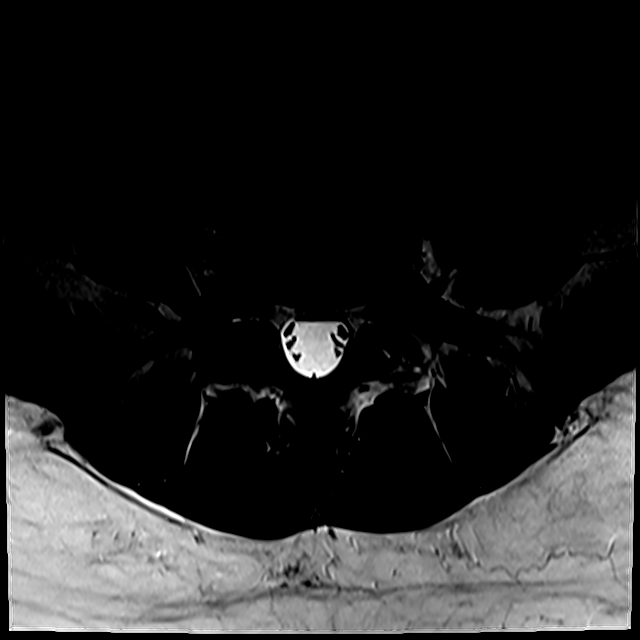
[im 8/39]
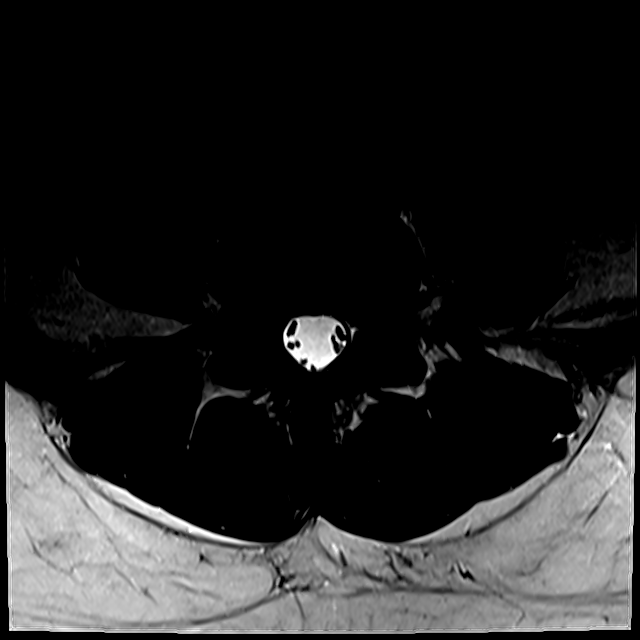
[im 13/39]
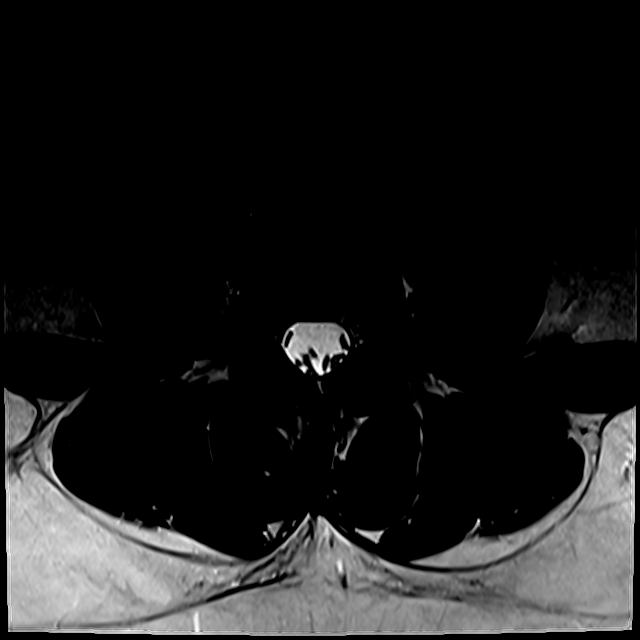
[im 21/39]
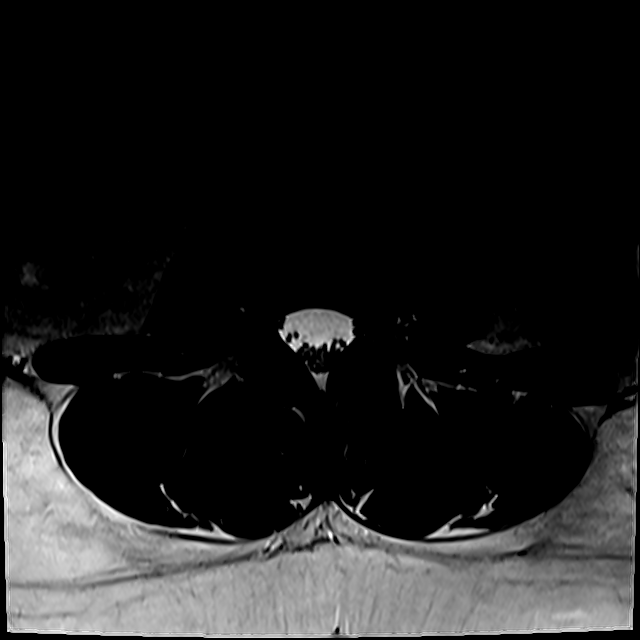
[im 33/39]
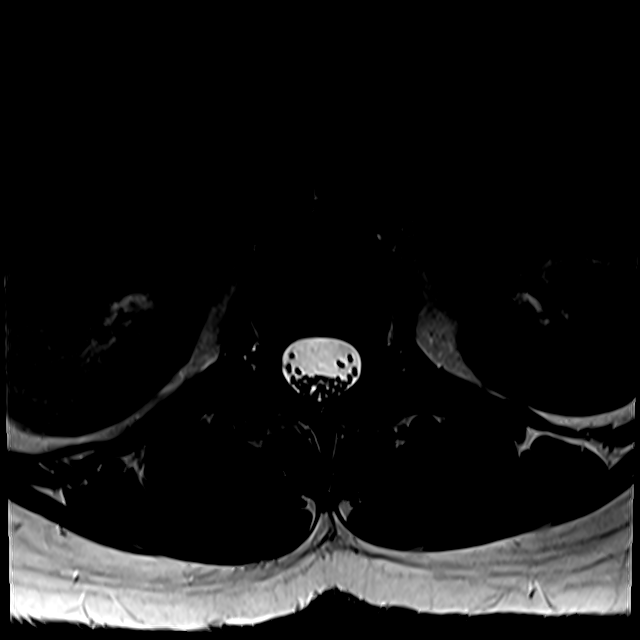

[18 of 48 positions shown; findings below may reference images not displayed]

FINDINGS: Segmentation: Normal on the 6093 CT, which is the same numbering
system used on the 7350 MRI.

Alignment: Stable since 7350. Relatively preserved lumbar lordosis.
Subtle chronic retrolisthesis of L5 on S1.

Vertebrae: New degenerative appearing anterior endplate marrow edema
at L5-S1 (series 8, image 6). This is eccentric to the right.

Underlying chronic abnormal but nonspecific diffusely decreased T1
and T2 bone marrow signal throughout the visible skeleton. It is
unclear whether there may be associated chronic decreased T2 signal
in the liver and spleen, such as can be seen with hemosiderosis.

No other marrow edema or acute osseous abnormality. Intact visible
sacrum and SI joints.

Conus medullaris and cauda equina: Conus extends to the T12-L1
level. No lower spinal cord or conus signal abnormality.

Paraspinal and other soft tissues: Negative visible abdominal
viscera. Negative visualized posterior paraspinal soft tissues.

Disc levels:

T11-T12: Negative.

T12-L1:  Negative.

L1-L2:  Negative.

L2-L3:  Negative.

L3-L4:  Negative.

L4-L5: Negative disc. Mild facet hypertrophy is stable. No stenosis.

L5-S1: Chronic disc desiccation. Chronic mild circumferential disc
bulge and endplate spurring. Mild to moderate facet hypertrophy.
Chronic mild degenerative facet joint fluid, regressed on the right
since 7350. No spinal or lateral recess stenosis. Broad-based right
foraminal component of disc appears increased since the prior MRI on
series 6, image 2 along with mild right L5 foraminal stenosis.
IMPRESSION: 1. Chronic disc degeneration at L5-S1 appears mildly progressed in
the right neural foramen since the 7350 MRI, query right L5
radiculitis. Furthermore, there is new degenerative anterior
endplate marrow edema, eccentric to the right. Chronic mild to
moderate facet degeneration at that level.
2. Chronic diffuse nonspecific decreased bone marrow signal. Query
chronic hemosiderosis, or chronic anemia.
3. No other lower thoracic or lumbar disc degeneration. There is
mild facet degeneration at L4-L5 without associated stenosis.

## 2020-08-10 DIAGNOSIS — G894 Chronic pain syndrome: Secondary | ICD-10-CM | POA: Diagnosis not present

## 2020-08-10 DIAGNOSIS — Z79891 Long term (current) use of opiate analgesic: Secondary | ICD-10-CM | POA: Diagnosis not present

## 2020-08-10 DIAGNOSIS — M47816 Spondylosis without myelopathy or radiculopathy, lumbar region: Secondary | ICD-10-CM | POA: Diagnosis not present

## 2020-08-10 DIAGNOSIS — G4700 Insomnia, unspecified: Secondary | ICD-10-CM | POA: Diagnosis not present

## 2020-09-04 DIAGNOSIS — M47816 Spondylosis without myelopathy or radiculopathy, lumbar region: Secondary | ICD-10-CM | POA: Diagnosis not present

## 2020-09-04 DIAGNOSIS — G894 Chronic pain syndrome: Secondary | ICD-10-CM | POA: Diagnosis not present

## 2020-09-04 DIAGNOSIS — Z79891 Long term (current) use of opiate analgesic: Secondary | ICD-10-CM | POA: Diagnosis not present

## 2020-09-04 DIAGNOSIS — G4700 Insomnia, unspecified: Secondary | ICD-10-CM | POA: Diagnosis not present

## 2020-09-04 DIAGNOSIS — M4726 Other spondylosis with radiculopathy, lumbar region: Secondary | ICD-10-CM | POA: Diagnosis not present

## 2020-09-21 DIAGNOSIS — M47816 Spondylosis without myelopathy or radiculopathy, lumbar region: Secondary | ICD-10-CM | POA: Diagnosis not present

## 2020-10-02 DIAGNOSIS — G894 Chronic pain syndrome: Secondary | ICD-10-CM | POA: Diagnosis not present

## 2020-10-02 DIAGNOSIS — G4700 Insomnia, unspecified: Secondary | ICD-10-CM | POA: Diagnosis not present

## 2020-10-02 DIAGNOSIS — M47816 Spondylosis without myelopathy or radiculopathy, lumbar region: Secondary | ICD-10-CM | POA: Diagnosis not present

## 2020-10-02 DIAGNOSIS — Z79891 Long term (current) use of opiate analgesic: Secondary | ICD-10-CM | POA: Diagnosis not present

## 2020-11-02 DIAGNOSIS — G4700 Insomnia, unspecified: Secondary | ICD-10-CM | POA: Diagnosis not present

## 2020-11-02 DIAGNOSIS — Z79891 Long term (current) use of opiate analgesic: Secondary | ICD-10-CM | POA: Diagnosis not present

## 2020-11-02 DIAGNOSIS — M47816 Spondylosis without myelopathy or radiculopathy, lumbar region: Secondary | ICD-10-CM | POA: Diagnosis not present

## 2020-11-02 DIAGNOSIS — G894 Chronic pain syndrome: Secondary | ICD-10-CM | POA: Diagnosis not present

## 2020-12-05 DIAGNOSIS — M47816 Spondylosis without myelopathy or radiculopathy, lumbar region: Secondary | ICD-10-CM | POA: Diagnosis not present

## 2020-12-05 DIAGNOSIS — G894 Chronic pain syndrome: Secondary | ICD-10-CM | POA: Diagnosis not present

## 2020-12-05 DIAGNOSIS — G4700 Insomnia, unspecified: Secondary | ICD-10-CM | POA: Diagnosis not present

## 2020-12-05 DIAGNOSIS — Z79891 Long term (current) use of opiate analgesic: Secondary | ICD-10-CM | POA: Diagnosis not present

## 2021-01-02 DIAGNOSIS — F419 Anxiety disorder, unspecified: Secondary | ICD-10-CM | POA: Diagnosis not present

## 2021-01-02 DIAGNOSIS — Z6836 Body mass index (BMI) 36.0-36.9, adult: Secondary | ICD-10-CM | POA: Diagnosis not present

## 2021-02-02 DIAGNOSIS — G894 Chronic pain syndrome: Secondary | ICD-10-CM | POA: Diagnosis not present

## 2021-02-02 DIAGNOSIS — G4700 Insomnia, unspecified: Secondary | ICD-10-CM | POA: Diagnosis not present

## 2021-02-02 DIAGNOSIS — M47816 Spondylosis without myelopathy or radiculopathy, lumbar region: Secondary | ICD-10-CM | POA: Diagnosis not present

## 2021-02-02 DIAGNOSIS — Z79891 Long term (current) use of opiate analgesic: Secondary | ICD-10-CM | POA: Diagnosis not present

## 2021-03-06 DIAGNOSIS — Z6834 Body mass index (BMI) 34.0-34.9, adult: Secondary | ICD-10-CM | POA: Diagnosis not present

## 2021-03-06 DIAGNOSIS — J45909 Unspecified asthma, uncomplicated: Secondary | ICD-10-CM | POA: Diagnosis not present

## 2021-03-08 DIAGNOSIS — G4700 Insomnia, unspecified: Secondary | ICD-10-CM | POA: Diagnosis not present

## 2021-03-08 DIAGNOSIS — M47816 Spondylosis without myelopathy or radiculopathy, lumbar region: Secondary | ICD-10-CM | POA: Diagnosis not present

## 2021-03-08 DIAGNOSIS — G894 Chronic pain syndrome: Secondary | ICD-10-CM | POA: Diagnosis not present

## 2021-03-08 DIAGNOSIS — Z79891 Long term (current) use of opiate analgesic: Secondary | ICD-10-CM | POA: Diagnosis not present

## 2021-04-10 DIAGNOSIS — M47816 Spondylosis without myelopathy or radiculopathy, lumbar region: Secondary | ICD-10-CM | POA: Diagnosis not present

## 2021-04-10 DIAGNOSIS — G894 Chronic pain syndrome: Secondary | ICD-10-CM | POA: Diagnosis not present

## 2021-04-10 DIAGNOSIS — Z79891 Long term (current) use of opiate analgesic: Secondary | ICD-10-CM | POA: Diagnosis not present

## 2021-04-10 DIAGNOSIS — G4700 Insomnia, unspecified: Secondary | ICD-10-CM | POA: Diagnosis not present

## 2021-05-08 DIAGNOSIS — G4700 Insomnia, unspecified: Secondary | ICD-10-CM | POA: Diagnosis not present

## 2021-05-08 DIAGNOSIS — Z6833 Body mass index (BMI) 33.0-33.9, adult: Secondary | ICD-10-CM | POA: Diagnosis not present

## 2021-05-08 DIAGNOSIS — G894 Chronic pain syndrome: Secondary | ICD-10-CM | POA: Diagnosis not present

## 2021-05-08 DIAGNOSIS — Z79891 Long term (current) use of opiate analgesic: Secondary | ICD-10-CM | POA: Diagnosis not present

## 2021-05-08 DIAGNOSIS — Z01419 Encounter for gynecological examination (general) (routine) without abnormal findings: Secondary | ICD-10-CM | POA: Diagnosis not present

## 2021-05-08 DIAGNOSIS — M47816 Spondylosis without myelopathy or radiculopathy, lumbar region: Secondary | ICD-10-CM | POA: Diagnosis not present

## 2021-05-09 DIAGNOSIS — M5417 Radiculopathy, lumbosacral region: Secondary | ICD-10-CM | POA: Diagnosis not present

## 2021-05-09 DIAGNOSIS — M9904 Segmental and somatic dysfunction of sacral region: Secondary | ICD-10-CM | POA: Diagnosis not present

## 2021-05-09 DIAGNOSIS — M9903 Segmental and somatic dysfunction of lumbar region: Secondary | ICD-10-CM | POA: Diagnosis not present

## 2021-05-09 DIAGNOSIS — M5418 Radiculopathy, sacral and sacrococcygeal region: Secondary | ICD-10-CM | POA: Diagnosis not present

## 2021-05-17 DIAGNOSIS — M5417 Radiculopathy, lumbosacral region: Secondary | ICD-10-CM | POA: Diagnosis not present

## 2021-05-17 DIAGNOSIS — M9904 Segmental and somatic dysfunction of sacral region: Secondary | ICD-10-CM | POA: Diagnosis not present

## 2021-05-17 DIAGNOSIS — M9903 Segmental and somatic dysfunction of lumbar region: Secondary | ICD-10-CM | POA: Diagnosis not present

## 2021-05-17 DIAGNOSIS — M5418 Radiculopathy, sacral and sacrococcygeal region: Secondary | ICD-10-CM | POA: Diagnosis not present

## 2021-05-21 DIAGNOSIS — M9904 Segmental and somatic dysfunction of sacral region: Secondary | ICD-10-CM | POA: Diagnosis not present

## 2021-05-21 DIAGNOSIS — M9903 Segmental and somatic dysfunction of lumbar region: Secondary | ICD-10-CM | POA: Diagnosis not present

## 2021-05-21 DIAGNOSIS — M5417 Radiculopathy, lumbosacral region: Secondary | ICD-10-CM | POA: Diagnosis not present

## 2021-05-21 DIAGNOSIS — M5418 Radiculopathy, sacral and sacrococcygeal region: Secondary | ICD-10-CM | POA: Diagnosis not present

## 2021-05-28 DIAGNOSIS — M5418 Radiculopathy, sacral and sacrococcygeal region: Secondary | ICD-10-CM | POA: Diagnosis not present

## 2021-05-28 DIAGNOSIS — M9904 Segmental and somatic dysfunction of sacral region: Secondary | ICD-10-CM | POA: Diagnosis not present

## 2021-05-28 DIAGNOSIS — M9903 Segmental and somatic dysfunction of lumbar region: Secondary | ICD-10-CM | POA: Diagnosis not present

## 2021-05-28 DIAGNOSIS — M5417 Radiculopathy, lumbosacral region: Secondary | ICD-10-CM | POA: Diagnosis not present

## 2021-05-29 DIAGNOSIS — M9903 Segmental and somatic dysfunction of lumbar region: Secondary | ICD-10-CM | POA: Diagnosis not present

## 2021-05-29 DIAGNOSIS — M5417 Radiculopathy, lumbosacral region: Secondary | ICD-10-CM | POA: Diagnosis not present

## 2021-05-29 DIAGNOSIS — M9904 Segmental and somatic dysfunction of sacral region: Secondary | ICD-10-CM | POA: Diagnosis not present

## 2021-05-29 DIAGNOSIS — M5418 Radiculopathy, sacral and sacrococcygeal region: Secondary | ICD-10-CM | POA: Diagnosis not present

## 2021-05-30 DIAGNOSIS — G4700 Insomnia, unspecified: Secondary | ICD-10-CM | POA: Diagnosis not present

## 2021-05-30 DIAGNOSIS — M9904 Segmental and somatic dysfunction of sacral region: Secondary | ICD-10-CM | POA: Diagnosis not present

## 2021-05-30 DIAGNOSIS — Z79891 Long term (current) use of opiate analgesic: Secondary | ICD-10-CM | POA: Diagnosis not present

## 2021-05-30 DIAGNOSIS — G894 Chronic pain syndrome: Secondary | ICD-10-CM | POA: Diagnosis not present

## 2021-05-30 DIAGNOSIS — M5417 Radiculopathy, lumbosacral region: Secondary | ICD-10-CM | POA: Diagnosis not present

## 2021-05-30 DIAGNOSIS — M5418 Radiculopathy, sacral and sacrococcygeal region: Secondary | ICD-10-CM | POA: Diagnosis not present

## 2021-05-30 DIAGNOSIS — M47816 Spondylosis without myelopathy or radiculopathy, lumbar region: Secondary | ICD-10-CM | POA: Diagnosis not present

## 2021-05-30 DIAGNOSIS — M9903 Segmental and somatic dysfunction of lumbar region: Secondary | ICD-10-CM | POA: Diagnosis not present

## 2021-06-07 DIAGNOSIS — M47816 Spondylosis without myelopathy or radiculopathy, lumbar region: Secondary | ICD-10-CM | POA: Diagnosis not present

## 2021-06-11 DIAGNOSIS — M9904 Segmental and somatic dysfunction of sacral region: Secondary | ICD-10-CM | POA: Diagnosis not present

## 2021-06-11 DIAGNOSIS — M5417 Radiculopathy, lumbosacral region: Secondary | ICD-10-CM | POA: Diagnosis not present

## 2021-06-11 DIAGNOSIS — M9903 Segmental and somatic dysfunction of lumbar region: Secondary | ICD-10-CM | POA: Diagnosis not present

## 2021-06-11 DIAGNOSIS — M5418 Radiculopathy, sacral and sacrococcygeal region: Secondary | ICD-10-CM | POA: Diagnosis not present

## 2021-06-13 DIAGNOSIS — M5417 Radiculopathy, lumbosacral region: Secondary | ICD-10-CM | POA: Diagnosis not present

## 2021-06-13 DIAGNOSIS — M5418 Radiculopathy, sacral and sacrococcygeal region: Secondary | ICD-10-CM | POA: Diagnosis not present

## 2021-06-13 DIAGNOSIS — M9904 Segmental and somatic dysfunction of sacral region: Secondary | ICD-10-CM | POA: Diagnosis not present

## 2021-06-13 DIAGNOSIS — M9903 Segmental and somatic dysfunction of lumbar region: Secondary | ICD-10-CM | POA: Diagnosis not present

## 2021-06-14 DIAGNOSIS — M9904 Segmental and somatic dysfunction of sacral region: Secondary | ICD-10-CM | POA: Diagnosis not present

## 2021-06-14 DIAGNOSIS — M5418 Radiculopathy, sacral and sacrococcygeal region: Secondary | ICD-10-CM | POA: Diagnosis not present

## 2021-06-14 DIAGNOSIS — M9903 Segmental and somatic dysfunction of lumbar region: Secondary | ICD-10-CM | POA: Diagnosis not present

## 2021-06-14 DIAGNOSIS — M5417 Radiculopathy, lumbosacral region: Secondary | ICD-10-CM | POA: Diagnosis not present

## 2021-06-30 DIAGNOSIS — Z20822 Contact with and (suspected) exposure to covid-19: Secondary | ICD-10-CM | POA: Diagnosis not present

## 2021-07-02 DIAGNOSIS — M5417 Radiculopathy, lumbosacral region: Secondary | ICD-10-CM | POA: Diagnosis not present

## 2021-07-02 DIAGNOSIS — M5418 Radiculopathy, sacral and sacrococcygeal region: Secondary | ICD-10-CM | POA: Diagnosis not present

## 2021-07-02 DIAGNOSIS — G894 Chronic pain syndrome: Secondary | ICD-10-CM | POA: Diagnosis not present

## 2021-07-02 DIAGNOSIS — M47816 Spondylosis without myelopathy or radiculopathy, lumbar region: Secondary | ICD-10-CM | POA: Diagnosis not present

## 2021-07-02 DIAGNOSIS — Z79891 Long term (current) use of opiate analgesic: Secondary | ICD-10-CM | POA: Diagnosis not present

## 2021-07-02 DIAGNOSIS — M9903 Segmental and somatic dysfunction of lumbar region: Secondary | ICD-10-CM | POA: Diagnosis not present

## 2021-07-02 DIAGNOSIS — M9904 Segmental and somatic dysfunction of sacral region: Secondary | ICD-10-CM | POA: Diagnosis not present

## 2021-07-02 DIAGNOSIS — G4700 Insomnia, unspecified: Secondary | ICD-10-CM | POA: Diagnosis not present

## 2021-07-03 DIAGNOSIS — M5418 Radiculopathy, sacral and sacrococcygeal region: Secondary | ICD-10-CM | POA: Diagnosis not present

## 2021-07-03 DIAGNOSIS — M5417 Radiculopathy, lumbosacral region: Secondary | ICD-10-CM | POA: Diagnosis not present

## 2021-07-03 DIAGNOSIS — M9903 Segmental and somatic dysfunction of lumbar region: Secondary | ICD-10-CM | POA: Diagnosis not present

## 2021-07-03 DIAGNOSIS — M9904 Segmental and somatic dysfunction of sacral region: Secondary | ICD-10-CM | POA: Diagnosis not present

## 2021-07-24 DIAGNOSIS — M5417 Radiculopathy, lumbosacral region: Secondary | ICD-10-CM | POA: Diagnosis not present

## 2021-07-24 DIAGNOSIS — M9903 Segmental and somatic dysfunction of lumbar region: Secondary | ICD-10-CM | POA: Diagnosis not present

## 2021-07-24 DIAGNOSIS — M9904 Segmental and somatic dysfunction of sacral region: Secondary | ICD-10-CM | POA: Diagnosis not present

## 2021-07-24 DIAGNOSIS — M5418 Radiculopathy, sacral and sacrococcygeal region: Secondary | ICD-10-CM | POA: Diagnosis not present

## 2021-07-25 DIAGNOSIS — M5417 Radiculopathy, lumbosacral region: Secondary | ICD-10-CM | POA: Diagnosis not present

## 2021-07-25 DIAGNOSIS — M5418 Radiculopathy, sacral and sacrococcygeal region: Secondary | ICD-10-CM | POA: Diagnosis not present

## 2021-07-25 DIAGNOSIS — M9904 Segmental and somatic dysfunction of sacral region: Secondary | ICD-10-CM | POA: Diagnosis not present

## 2021-07-25 DIAGNOSIS — M9903 Segmental and somatic dysfunction of lumbar region: Secondary | ICD-10-CM | POA: Diagnosis not present

## 2021-07-26 DIAGNOSIS — M9903 Segmental and somatic dysfunction of lumbar region: Secondary | ICD-10-CM | POA: Diagnosis not present

## 2021-07-26 DIAGNOSIS — M5417 Radiculopathy, lumbosacral region: Secondary | ICD-10-CM | POA: Diagnosis not present

## 2021-07-26 DIAGNOSIS — M5418 Radiculopathy, sacral and sacrococcygeal region: Secondary | ICD-10-CM | POA: Diagnosis not present

## 2021-07-26 DIAGNOSIS — M9904 Segmental and somatic dysfunction of sacral region: Secondary | ICD-10-CM | POA: Diagnosis not present

## 2021-07-30 DIAGNOSIS — M5418 Radiculopathy, sacral and sacrococcygeal region: Secondary | ICD-10-CM | POA: Diagnosis not present

## 2021-07-30 DIAGNOSIS — M9904 Segmental and somatic dysfunction of sacral region: Secondary | ICD-10-CM | POA: Diagnosis not present

## 2021-07-30 DIAGNOSIS — M5417 Radiculopathy, lumbosacral region: Secondary | ICD-10-CM | POA: Diagnosis not present

## 2021-07-30 DIAGNOSIS — M9903 Segmental and somatic dysfunction of lumbar region: Secondary | ICD-10-CM | POA: Diagnosis not present

## 2021-08-01 DIAGNOSIS — M9903 Segmental and somatic dysfunction of lumbar region: Secondary | ICD-10-CM | POA: Diagnosis not present

## 2021-08-01 DIAGNOSIS — M9904 Segmental and somatic dysfunction of sacral region: Secondary | ICD-10-CM | POA: Diagnosis not present

## 2021-08-01 DIAGNOSIS — G894 Chronic pain syndrome: Secondary | ICD-10-CM | POA: Diagnosis not present

## 2021-08-01 DIAGNOSIS — Z79891 Long term (current) use of opiate analgesic: Secondary | ICD-10-CM | POA: Diagnosis not present

## 2021-08-01 DIAGNOSIS — G4700 Insomnia, unspecified: Secondary | ICD-10-CM | POA: Diagnosis not present

## 2021-08-01 DIAGNOSIS — M5418 Radiculopathy, sacral and sacrococcygeal region: Secondary | ICD-10-CM | POA: Diagnosis not present

## 2021-08-01 DIAGNOSIS — M47816 Spondylosis without myelopathy or radiculopathy, lumbar region: Secondary | ICD-10-CM | POA: Diagnosis not present

## 2021-08-01 DIAGNOSIS — M5417 Radiculopathy, lumbosacral region: Secondary | ICD-10-CM | POA: Diagnosis not present

## 2021-08-07 DIAGNOSIS — M5417 Radiculopathy, lumbosacral region: Secondary | ICD-10-CM | POA: Diagnosis not present

## 2021-08-07 DIAGNOSIS — M5418 Radiculopathy, sacral and sacrococcygeal region: Secondary | ICD-10-CM | POA: Diagnosis not present

## 2021-08-07 DIAGNOSIS — M9903 Segmental and somatic dysfunction of lumbar region: Secondary | ICD-10-CM | POA: Diagnosis not present

## 2021-08-07 DIAGNOSIS — M9904 Segmental and somatic dysfunction of sacral region: Secondary | ICD-10-CM | POA: Diagnosis not present

## 2021-08-09 DIAGNOSIS — M5418 Radiculopathy, sacral and sacrococcygeal region: Secondary | ICD-10-CM | POA: Diagnosis not present

## 2021-08-09 DIAGNOSIS — M5417 Radiculopathy, lumbosacral region: Secondary | ICD-10-CM | POA: Diagnosis not present

## 2021-08-09 DIAGNOSIS — M9903 Segmental and somatic dysfunction of lumbar region: Secondary | ICD-10-CM | POA: Diagnosis not present

## 2021-08-09 DIAGNOSIS — M9904 Segmental and somatic dysfunction of sacral region: Secondary | ICD-10-CM | POA: Diagnosis not present

## 2021-08-13 DIAGNOSIS — M9904 Segmental and somatic dysfunction of sacral region: Secondary | ICD-10-CM | POA: Diagnosis not present

## 2021-08-13 DIAGNOSIS — M5417 Radiculopathy, lumbosacral region: Secondary | ICD-10-CM | POA: Diagnosis not present

## 2021-08-13 DIAGNOSIS — M5418 Radiculopathy, sacral and sacrococcygeal region: Secondary | ICD-10-CM | POA: Diagnosis not present

## 2021-08-13 DIAGNOSIS — M9903 Segmental and somatic dysfunction of lumbar region: Secondary | ICD-10-CM | POA: Diagnosis not present

## 2021-08-15 DIAGNOSIS — M5417 Radiculopathy, lumbosacral region: Secondary | ICD-10-CM | POA: Diagnosis not present

## 2021-08-15 DIAGNOSIS — M5418 Radiculopathy, sacral and sacrococcygeal region: Secondary | ICD-10-CM | POA: Diagnosis not present

## 2021-08-15 DIAGNOSIS — M9904 Segmental and somatic dysfunction of sacral region: Secondary | ICD-10-CM | POA: Diagnosis not present

## 2021-08-15 DIAGNOSIS — M9903 Segmental and somatic dysfunction of lumbar region: Secondary | ICD-10-CM | POA: Diagnosis not present

## 2021-08-22 DIAGNOSIS — M9904 Segmental and somatic dysfunction of sacral region: Secondary | ICD-10-CM | POA: Diagnosis not present

## 2021-08-22 DIAGNOSIS — M9903 Segmental and somatic dysfunction of lumbar region: Secondary | ICD-10-CM | POA: Diagnosis not present

## 2021-08-22 DIAGNOSIS — M5417 Radiculopathy, lumbosacral region: Secondary | ICD-10-CM | POA: Diagnosis not present

## 2021-08-22 DIAGNOSIS — M5418 Radiculopathy, sacral and sacrococcygeal region: Secondary | ICD-10-CM | POA: Diagnosis not present

## 2021-08-30 DIAGNOSIS — M47816 Spondylosis without myelopathy or radiculopathy, lumbar region: Secondary | ICD-10-CM | POA: Diagnosis not present

## 2021-08-30 DIAGNOSIS — G894 Chronic pain syndrome: Secondary | ICD-10-CM | POA: Diagnosis not present

## 2021-08-30 DIAGNOSIS — G4700 Insomnia, unspecified: Secondary | ICD-10-CM | POA: Diagnosis not present

## 2021-08-30 DIAGNOSIS — Z79891 Long term (current) use of opiate analgesic: Secondary | ICD-10-CM | POA: Diagnosis not present

## 2021-09-24 DIAGNOSIS — G4700 Insomnia, unspecified: Secondary | ICD-10-CM | POA: Diagnosis not present

## 2021-09-24 DIAGNOSIS — M9903 Segmental and somatic dysfunction of lumbar region: Secondary | ICD-10-CM | POA: Diagnosis not present

## 2021-09-24 DIAGNOSIS — M47816 Spondylosis without myelopathy or radiculopathy, lumbar region: Secondary | ICD-10-CM | POA: Diagnosis not present

## 2021-09-24 DIAGNOSIS — G894 Chronic pain syndrome: Secondary | ICD-10-CM | POA: Diagnosis not present

## 2021-09-24 DIAGNOSIS — Z79891 Long term (current) use of opiate analgesic: Secondary | ICD-10-CM | POA: Diagnosis not present

## 2021-09-24 DIAGNOSIS — M5417 Radiculopathy, lumbosacral region: Secondary | ICD-10-CM | POA: Diagnosis not present

## 2021-09-24 DIAGNOSIS — M5418 Radiculopathy, sacral and sacrococcygeal region: Secondary | ICD-10-CM | POA: Diagnosis not present

## 2021-09-24 DIAGNOSIS — M9904 Segmental and somatic dysfunction of sacral region: Secondary | ICD-10-CM | POA: Diagnosis not present

## 2021-10-24 DIAGNOSIS — G4700 Insomnia, unspecified: Secondary | ICD-10-CM | POA: Diagnosis not present

## 2021-10-24 DIAGNOSIS — M47816 Spondylosis without myelopathy or radiculopathy, lumbar region: Secondary | ICD-10-CM | POA: Diagnosis not present

## 2021-10-24 DIAGNOSIS — Z79891 Long term (current) use of opiate analgesic: Secondary | ICD-10-CM | POA: Diagnosis not present

## 2021-10-24 DIAGNOSIS — G894 Chronic pain syndrome: Secondary | ICD-10-CM | POA: Diagnosis not present

## 2021-11-22 DIAGNOSIS — G4700 Insomnia, unspecified: Secondary | ICD-10-CM | POA: Diagnosis not present

## 2021-11-22 DIAGNOSIS — Z79891 Long term (current) use of opiate analgesic: Secondary | ICD-10-CM | POA: Diagnosis not present

## 2021-11-22 DIAGNOSIS — M47816 Spondylosis without myelopathy or radiculopathy, lumbar region: Secondary | ICD-10-CM | POA: Diagnosis not present

## 2021-11-22 DIAGNOSIS — G894 Chronic pain syndrome: Secondary | ICD-10-CM | POA: Diagnosis not present

## 2021-12-25 DIAGNOSIS — M47816 Spondylosis without myelopathy or radiculopathy, lumbar region: Secondary | ICD-10-CM | POA: Diagnosis not present

## 2021-12-25 DIAGNOSIS — Z79891 Long term (current) use of opiate analgesic: Secondary | ICD-10-CM | POA: Diagnosis not present

## 2021-12-25 DIAGNOSIS — G894 Chronic pain syndrome: Secondary | ICD-10-CM | POA: Diagnosis not present

## 2021-12-25 DIAGNOSIS — G4700 Insomnia, unspecified: Secondary | ICD-10-CM | POA: Diagnosis not present

## 2022-01-25 DIAGNOSIS — G4700 Insomnia, unspecified: Secondary | ICD-10-CM | POA: Diagnosis not present

## 2022-01-25 DIAGNOSIS — Z79891 Long term (current) use of opiate analgesic: Secondary | ICD-10-CM | POA: Diagnosis not present

## 2022-01-25 DIAGNOSIS — M47816 Spondylosis without myelopathy or radiculopathy, lumbar region: Secondary | ICD-10-CM | POA: Diagnosis not present

## 2022-01-25 DIAGNOSIS — G894 Chronic pain syndrome: Secondary | ICD-10-CM | POA: Diagnosis not present

## 2022-02-18 DIAGNOSIS — Z79891 Long term (current) use of opiate analgesic: Secondary | ICD-10-CM | POA: Diagnosis not present

## 2022-02-18 DIAGNOSIS — M47816 Spondylosis without myelopathy or radiculopathy, lumbar region: Secondary | ICD-10-CM | POA: Diagnosis not present

## 2022-02-18 DIAGNOSIS — G894 Chronic pain syndrome: Secondary | ICD-10-CM | POA: Diagnosis not present

## 2022-02-18 DIAGNOSIS — G4700 Insomnia, unspecified: Secondary | ICD-10-CM | POA: Diagnosis not present

## 2022-03-20 DIAGNOSIS — M47816 Spondylosis without myelopathy or radiculopathy, lumbar region: Secondary | ICD-10-CM | POA: Diagnosis not present

## 2022-03-20 DIAGNOSIS — Z79891 Long term (current) use of opiate analgesic: Secondary | ICD-10-CM | POA: Diagnosis not present

## 2022-03-20 DIAGNOSIS — G894 Chronic pain syndrome: Secondary | ICD-10-CM | POA: Diagnosis not present

## 2022-03-20 DIAGNOSIS — G4700 Insomnia, unspecified: Secondary | ICD-10-CM | POA: Diagnosis not present

## 2022-03-28 DIAGNOSIS — M47816 Spondylosis without myelopathy or radiculopathy, lumbar region: Secondary | ICD-10-CM | POA: Diagnosis not present

## 2022-04-22 DIAGNOSIS — G894 Chronic pain syndrome: Secondary | ICD-10-CM | POA: Diagnosis not present

## 2022-04-22 DIAGNOSIS — M47816 Spondylosis without myelopathy or radiculopathy, lumbar region: Secondary | ICD-10-CM | POA: Diagnosis not present

## 2022-04-22 DIAGNOSIS — Z79891 Long term (current) use of opiate analgesic: Secondary | ICD-10-CM | POA: Diagnosis not present

## 2022-04-22 DIAGNOSIS — G4700 Insomnia, unspecified: Secondary | ICD-10-CM | POA: Diagnosis not present

## 2022-05-22 DIAGNOSIS — G894 Chronic pain syndrome: Secondary | ICD-10-CM | POA: Diagnosis not present

## 2022-05-22 DIAGNOSIS — G4700 Insomnia, unspecified: Secondary | ICD-10-CM | POA: Diagnosis not present

## 2022-05-22 DIAGNOSIS — Z79891 Long term (current) use of opiate analgesic: Secondary | ICD-10-CM | POA: Diagnosis not present

## 2022-05-22 DIAGNOSIS — M47816 Spondylosis without myelopathy or radiculopathy, lumbar region: Secondary | ICD-10-CM | POA: Diagnosis not present

## 2022-06-20 DIAGNOSIS — G4700 Insomnia, unspecified: Secondary | ICD-10-CM | POA: Diagnosis not present

## 2022-06-20 DIAGNOSIS — G894 Chronic pain syndrome: Secondary | ICD-10-CM | POA: Diagnosis not present

## 2022-06-20 DIAGNOSIS — Z79891 Long term (current) use of opiate analgesic: Secondary | ICD-10-CM | POA: Diagnosis not present

## 2022-06-20 DIAGNOSIS — M47816 Spondylosis without myelopathy or radiculopathy, lumbar region: Secondary | ICD-10-CM | POA: Diagnosis not present

## 2022-07-23 DIAGNOSIS — G4700 Insomnia, unspecified: Secondary | ICD-10-CM | POA: Diagnosis not present

## 2022-07-23 DIAGNOSIS — G894 Chronic pain syndrome: Secondary | ICD-10-CM | POA: Diagnosis not present

## 2022-07-23 DIAGNOSIS — Z79891 Long term (current) use of opiate analgesic: Secondary | ICD-10-CM | POA: Diagnosis not present

## 2022-07-23 DIAGNOSIS — M47816 Spondylosis without myelopathy or radiculopathy, lumbar region: Secondary | ICD-10-CM | POA: Diagnosis not present

## 2022-09-12 DIAGNOSIS — G4700 Insomnia, unspecified: Secondary | ICD-10-CM | POA: Diagnosis not present

## 2022-09-12 DIAGNOSIS — Z79891 Long term (current) use of opiate analgesic: Secondary | ICD-10-CM | POA: Diagnosis not present

## 2022-09-12 DIAGNOSIS — G894 Chronic pain syndrome: Secondary | ICD-10-CM | POA: Diagnosis not present

## 2022-09-12 DIAGNOSIS — M47816 Spondylosis without myelopathy or radiculopathy, lumbar region: Secondary | ICD-10-CM | POA: Diagnosis not present

## 2022-10-10 DIAGNOSIS — M47816 Spondylosis without myelopathy or radiculopathy, lumbar region: Secondary | ICD-10-CM | POA: Diagnosis not present

## 2022-10-10 DIAGNOSIS — G4700 Insomnia, unspecified: Secondary | ICD-10-CM | POA: Diagnosis not present

## 2022-10-10 DIAGNOSIS — G894 Chronic pain syndrome: Secondary | ICD-10-CM | POA: Diagnosis not present

## 2022-10-10 DIAGNOSIS — Z79891 Long term (current) use of opiate analgesic: Secondary | ICD-10-CM | POA: Diagnosis not present

## 2022-11-12 DIAGNOSIS — G894 Chronic pain syndrome: Secondary | ICD-10-CM | POA: Diagnosis not present

## 2022-11-12 DIAGNOSIS — G4700 Insomnia, unspecified: Secondary | ICD-10-CM | POA: Diagnosis not present

## 2022-11-12 DIAGNOSIS — M47816 Spondylosis without myelopathy or radiculopathy, lumbar region: Secondary | ICD-10-CM | POA: Diagnosis not present

## 2022-11-12 DIAGNOSIS — Z79891 Long term (current) use of opiate analgesic: Secondary | ICD-10-CM | POA: Diagnosis not present

## 2022-12-13 DIAGNOSIS — M47816 Spondylosis without myelopathy or radiculopathy, lumbar region: Secondary | ICD-10-CM | POA: Diagnosis not present

## 2022-12-13 DIAGNOSIS — G4700 Insomnia, unspecified: Secondary | ICD-10-CM | POA: Diagnosis not present

## 2022-12-13 DIAGNOSIS — G894 Chronic pain syndrome: Secondary | ICD-10-CM | POA: Diagnosis not present

## 2022-12-13 DIAGNOSIS — Z79891 Long term (current) use of opiate analgesic: Secondary | ICD-10-CM | POA: Diagnosis not present

## 2023-01-14 DIAGNOSIS — G4700 Insomnia, unspecified: Secondary | ICD-10-CM | POA: Diagnosis not present

## 2023-01-14 DIAGNOSIS — G894 Chronic pain syndrome: Secondary | ICD-10-CM | POA: Diagnosis not present

## 2023-01-14 DIAGNOSIS — Z79891 Long term (current) use of opiate analgesic: Secondary | ICD-10-CM | POA: Diagnosis not present

## 2023-01-14 DIAGNOSIS — M47816 Spondylosis without myelopathy or radiculopathy, lumbar region: Secondary | ICD-10-CM | POA: Diagnosis not present

## 2023-02-10 DIAGNOSIS — G4700 Insomnia, unspecified: Secondary | ICD-10-CM | POA: Diagnosis not present

## 2023-02-10 DIAGNOSIS — M47816 Spondylosis without myelopathy or radiculopathy, lumbar region: Secondary | ICD-10-CM | POA: Diagnosis not present

## 2023-02-10 DIAGNOSIS — G894 Chronic pain syndrome: Secondary | ICD-10-CM | POA: Diagnosis not present

## 2023-02-10 DIAGNOSIS — Z79891 Long term (current) use of opiate analgesic: Secondary | ICD-10-CM | POA: Diagnosis not present

## 2023-03-12 DIAGNOSIS — G4700 Insomnia, unspecified: Secondary | ICD-10-CM | POA: Diagnosis not present

## 2023-03-12 DIAGNOSIS — Z79891 Long term (current) use of opiate analgesic: Secondary | ICD-10-CM | POA: Diagnosis not present

## 2023-03-12 DIAGNOSIS — G894 Chronic pain syndrome: Secondary | ICD-10-CM | POA: Diagnosis not present

## 2023-03-12 DIAGNOSIS — M47816 Spondylosis without myelopathy or radiculopathy, lumbar region: Secondary | ICD-10-CM | POA: Diagnosis not present

## 2023-04-11 DIAGNOSIS — G894 Chronic pain syndrome: Secondary | ICD-10-CM | POA: Diagnosis not present

## 2023-04-11 DIAGNOSIS — M47816 Spondylosis without myelopathy or radiculopathy, lumbar region: Secondary | ICD-10-CM | POA: Diagnosis not present

## 2023-04-11 DIAGNOSIS — Z79891 Long term (current) use of opiate analgesic: Secondary | ICD-10-CM | POA: Diagnosis not present

## 2023-04-11 DIAGNOSIS — G4700 Insomnia, unspecified: Secondary | ICD-10-CM | POA: Diagnosis not present

## 2023-05-13 DIAGNOSIS — Z79891 Long term (current) use of opiate analgesic: Secondary | ICD-10-CM | POA: Diagnosis not present

## 2023-05-13 DIAGNOSIS — M47816 Spondylosis without myelopathy or radiculopathy, lumbar region: Secondary | ICD-10-CM | POA: Diagnosis not present

## 2023-05-13 DIAGNOSIS — G894 Chronic pain syndrome: Secondary | ICD-10-CM | POA: Diagnosis not present

## 2023-05-13 DIAGNOSIS — G4700 Insomnia, unspecified: Secondary | ICD-10-CM | POA: Diagnosis not present

## 2023-05-22 DIAGNOSIS — M47816 Spondylosis without myelopathy or radiculopathy, lumbar region: Secondary | ICD-10-CM | POA: Diagnosis not present

## 2023-06-12 DIAGNOSIS — M47816 Spondylosis without myelopathy or radiculopathy, lumbar region: Secondary | ICD-10-CM | POA: Diagnosis not present

## 2023-06-12 DIAGNOSIS — G894 Chronic pain syndrome: Secondary | ICD-10-CM | POA: Diagnosis not present

## 2023-06-12 DIAGNOSIS — G4700 Insomnia, unspecified: Secondary | ICD-10-CM | POA: Diagnosis not present

## 2023-06-12 DIAGNOSIS — Z79891 Long term (current) use of opiate analgesic: Secondary | ICD-10-CM | POA: Diagnosis not present

## 2023-07-07 DIAGNOSIS — G894 Chronic pain syndrome: Secondary | ICD-10-CM | POA: Diagnosis not present

## 2023-07-07 DIAGNOSIS — M47816 Spondylosis without myelopathy or radiculopathy, lumbar region: Secondary | ICD-10-CM | POA: Diagnosis not present

## 2023-07-07 DIAGNOSIS — Z79891 Long term (current) use of opiate analgesic: Secondary | ICD-10-CM | POA: Diagnosis not present

## 2023-07-07 DIAGNOSIS — G4700 Insomnia, unspecified: Secondary | ICD-10-CM | POA: Diagnosis not present

## 2023-08-06 DIAGNOSIS — Z79891 Long term (current) use of opiate analgesic: Secondary | ICD-10-CM | POA: Diagnosis not present

## 2023-08-06 DIAGNOSIS — G894 Chronic pain syndrome: Secondary | ICD-10-CM | POA: Diagnosis not present

## 2023-08-06 DIAGNOSIS — G4700 Insomnia, unspecified: Secondary | ICD-10-CM | POA: Diagnosis not present

## 2023-08-06 DIAGNOSIS — M47816 Spondylosis without myelopathy or radiculopathy, lumbar region: Secondary | ICD-10-CM | POA: Diagnosis not present

## 2023-08-26 ENCOUNTER — Other Ambulatory Visit: Payer: Self-pay | Admitting: Physical Medicine and Rehabilitation

## 2023-08-26 DIAGNOSIS — M5416 Radiculopathy, lumbar region: Secondary | ICD-10-CM

## 2023-09-04 DIAGNOSIS — G4700 Insomnia, unspecified: Secondary | ICD-10-CM | POA: Diagnosis not present

## 2023-09-04 DIAGNOSIS — Z79891 Long term (current) use of opiate analgesic: Secondary | ICD-10-CM | POA: Diagnosis not present

## 2023-09-04 DIAGNOSIS — M47816 Spondylosis without myelopathy or radiculopathy, lumbar region: Secondary | ICD-10-CM | POA: Diagnosis not present

## 2023-09-04 DIAGNOSIS — G894 Chronic pain syndrome: Secondary | ICD-10-CM | POA: Diagnosis not present

## 2023-09-12 ENCOUNTER — Encounter: Payer: Self-pay | Admitting: Physical Medicine and Rehabilitation

## 2023-09-15 ENCOUNTER — Encounter: Payer: Self-pay | Admitting: Physical Medicine and Rehabilitation

## 2023-09-17 ENCOUNTER — Encounter: Payer: Self-pay | Admitting: Physical Medicine and Rehabilitation

## 2023-09-21 ENCOUNTER — Ambulatory Visit
Admission: RE | Admit: 2023-09-21 | Discharge: 2023-09-21 | Disposition: A | Discharge status: Home / Self Care | Payer: BC Managed Care – PPO | Source: Ambulatory Visit | Attending: Physical Medicine and Rehabilitation | Admitting: Physical Medicine and Rehabilitation

## 2023-09-21 DIAGNOSIS — M47816 Spondylosis without myelopathy or radiculopathy, lumbar region: Secondary | ICD-10-CM | POA: Diagnosis not present

## 2023-09-21 DIAGNOSIS — M5416 Radiculopathy, lumbar region: Secondary | ICD-10-CM

## 2023-09-21 DIAGNOSIS — M5126 Other intervertebral disc displacement, lumbar region: Secondary | ICD-10-CM | POA: Diagnosis not present

## 2023-10-07 DIAGNOSIS — Z79891 Long term (current) use of opiate analgesic: Secondary | ICD-10-CM | POA: Diagnosis not present

## 2023-10-07 DIAGNOSIS — G894 Chronic pain syndrome: Secondary | ICD-10-CM | POA: Diagnosis not present

## 2023-10-07 DIAGNOSIS — G4700 Insomnia, unspecified: Secondary | ICD-10-CM | POA: Diagnosis not present

## 2023-10-07 DIAGNOSIS — M47816 Spondylosis without myelopathy or radiculopathy, lumbar region: Secondary | ICD-10-CM | POA: Diagnosis not present

## 2023-11-06 DIAGNOSIS — G4700 Insomnia, unspecified: Secondary | ICD-10-CM | POA: Diagnosis not present

## 2023-11-06 DIAGNOSIS — Z79891 Long term (current) use of opiate analgesic: Secondary | ICD-10-CM | POA: Diagnosis not present

## 2023-11-06 DIAGNOSIS — G894 Chronic pain syndrome: Secondary | ICD-10-CM | POA: Diagnosis not present

## 2023-11-06 DIAGNOSIS — M47816 Spondylosis without myelopathy or radiculopathy, lumbar region: Secondary | ICD-10-CM | POA: Diagnosis not present

## 2023-12-01 DIAGNOSIS — G894 Chronic pain syndrome: Secondary | ICD-10-CM | POA: Diagnosis not present

## 2023-12-01 DIAGNOSIS — M47816 Spondylosis without myelopathy or radiculopathy, lumbar region: Secondary | ICD-10-CM | POA: Diagnosis not present

## 2023-12-01 DIAGNOSIS — G4700 Insomnia, unspecified: Secondary | ICD-10-CM | POA: Diagnosis not present

## 2023-12-01 DIAGNOSIS — Z79891 Long term (current) use of opiate analgesic: Secondary | ICD-10-CM | POA: Diagnosis not present

## 2024-11-10 ENCOUNTER — Other Ambulatory Visit: Payer: Self-pay | Admitting: Nurse Practitioner

## 2024-11-10 DIAGNOSIS — Z1231 Encounter for screening mammogram for malignant neoplasm of breast: Secondary | ICD-10-CM
